# Patient Record
Sex: Female | Born: 1950
Health system: Southern US, Community
[De-identification: ages and names within clinical notes are randomized; demographics above are authoritative.]

## PROBLEM LIST (undated history)

## (undated) DIAGNOSIS — Z972 Presence of dental prosthetic device (complete) (partial): Secondary | ICD-10-CM

## (undated) DIAGNOSIS — K219 Gastro-esophageal reflux disease without esophagitis: Secondary | ICD-10-CM

## (undated) DIAGNOSIS — J449 Chronic obstructive pulmonary disease, unspecified: Secondary | ICD-10-CM

## (undated) DIAGNOSIS — Z973 Presence of spectacles and contact lenses: Secondary | ICD-10-CM

## (undated) DIAGNOSIS — M1990 Unspecified osteoarthritis, unspecified site: Secondary | ICD-10-CM

## (undated) DIAGNOSIS — I6529 Occlusion and stenosis of unspecified carotid artery: Secondary | ICD-10-CM

## (undated) DIAGNOSIS — I509 Heart failure, unspecified: Secondary | ICD-10-CM

## (undated) DIAGNOSIS — I447 Left bundle-branch block, unspecified: Secondary | ICD-10-CM

## (undated) DIAGNOSIS — T7840XA Allergy, unspecified, initial encounter: Secondary | ICD-10-CM

## (undated) DIAGNOSIS — G5600 Carpal tunnel syndrome, unspecified upper limb: Secondary | ICD-10-CM

## (undated) DIAGNOSIS — I420 Dilated cardiomyopathy: Secondary | ICD-10-CM

## (undated) HISTORY — DX: Allergy, unspecified, initial encounter: T78.40XA

## (undated) HISTORY — DX: Gastro-esophageal reflux disease without esophagitis: K21.9

## (undated) HISTORY — PX: CYST EXCISION: SHX5701

## (undated) HISTORY — DX: Presence of spectacles and contact lenses: Z97.3

## (undated) HISTORY — DX: Occlusion and stenosis of unspecified carotid artery: I65.29

## (undated) HISTORY — DX: Presence of dental prosthetic device (complete) (partial): Z97.2

---

## 1972-03-25 HISTORY — PX: ABDOMINAL HYSTERECTOMY: SHX81

## 1998-03-25 HISTORY — PX: HERNIA REPAIR: SHX51

## 1998-03-26 ENCOUNTER — Emergency Department (HOSPITAL_COMMUNITY): Admission: EM | Admit: 1998-03-26 | Discharge: 1998-03-26 | Payer: Self-pay | Admitting: Emergency Medicine

## 1998-03-26 ENCOUNTER — Encounter: Payer: Self-pay | Admitting: Emergency Medicine

## 1999-09-24 ENCOUNTER — Other Ambulatory Visit: Admission: RE | Admit: 1999-09-24 | Discharge: 1999-09-24 | Payer: Self-pay | Admitting: Internal Medicine

## 1999-10-29 ENCOUNTER — Encounter: Payer: Self-pay | Admitting: Internal Medicine

## 1999-10-29 ENCOUNTER — Encounter: Admission: RE | Admit: 1999-10-29 | Discharge: 1999-10-29 | Payer: Self-pay | Admitting: Internal Medicine

## 2000-01-24 ENCOUNTER — Emergency Department (HOSPITAL_COMMUNITY): Admission: EM | Admit: 2000-01-24 | Discharge: 2000-01-24 | Payer: Self-pay | Admitting: Emergency Medicine

## 2000-01-28 ENCOUNTER — Emergency Department (HOSPITAL_COMMUNITY): Admission: EM | Admit: 2000-01-28 | Discharge: 2000-01-29 | Payer: Self-pay

## 2003-03-26 HISTORY — PX: KNEE SURGERY: SHX244

## 2003-04-06 ENCOUNTER — Encounter: Admission: RE | Admit: 2003-04-06 | Discharge: 2003-04-06 | Payer: Self-pay | Admitting: Family Medicine

## 2003-10-10 ENCOUNTER — Ambulatory Visit (HOSPITAL_COMMUNITY): Admission: RE | Admit: 2003-10-10 | Discharge: 2003-10-10 | Payer: Self-pay | Admitting: Family Medicine

## 2004-03-25 HISTORY — PX: COLONOSCOPY: SHX174

## 2004-04-09 ENCOUNTER — Ambulatory Visit (HOSPITAL_COMMUNITY): Admission: RE | Admit: 2004-04-09 | Discharge: 2004-04-09 | Payer: Self-pay | Admitting: Family Medicine

## 2005-11-27 ENCOUNTER — Encounter (HOSPITAL_COMMUNITY): Admission: RE | Admit: 2005-11-27 | Discharge: 2005-12-21 | Payer: Self-pay | Admitting: Orthopedic Surgery

## 2006-03-25 HISTORY — PX: FOOT NEUROMA SURGERY: SHX646

## 2006-10-23 ENCOUNTER — Ambulatory Visit (HOSPITAL_COMMUNITY): Admission: RE | Admit: 2006-10-23 | Discharge: 2006-10-23 | Payer: Self-pay | Admitting: Family Medicine

## 2006-11-05 ENCOUNTER — Ambulatory Visit (HOSPITAL_COMMUNITY): Admission: RE | Admit: 2006-11-05 | Discharge: 2006-11-05 | Payer: Self-pay | Admitting: Family Medicine

## 2008-05-19 ENCOUNTER — Ambulatory Visit (HOSPITAL_COMMUNITY): Admission: RE | Admit: 2008-05-19 | Discharge: 2008-05-19 | Payer: Self-pay | Admitting: Podiatry

## 2008-06-02 ENCOUNTER — Ambulatory Visit (HOSPITAL_COMMUNITY): Admission: RE | Admit: 2008-06-02 | Discharge: 2008-06-02 | Payer: Self-pay | Admitting: Orthopaedic Surgery

## 2010-01-14 ENCOUNTER — Emergency Department (HOSPITAL_COMMUNITY): Admission: EM | Admit: 2010-01-14 | Discharge: 2010-01-14 | Payer: Self-pay | Admitting: Emergency Medicine

## 2010-03-25 HISTORY — PX: KNEE SURGERY: SHX244

## 2010-04-15 ENCOUNTER — Encounter: Payer: Self-pay | Admitting: Urology

## 2010-04-15 ENCOUNTER — Encounter: Payer: Self-pay | Admitting: Internal Medicine

## 2010-08-10 NOTE — Procedures (Signed)
NAMEDOREATHER, HOXWORTH               ACCOUNT NO.:  [id number]   MEDICAL RECORD NO.:  [id number]          PATIENT TYPE:  OUT   LOCATION:  RESP                          FACILITY:  [facility]   PHYSICIAN:  Edward L. Juanetta Gosling, M.D.DATE OF BIRTH:  1950/09/27   DATE OF PROCEDURE:  DATE OF DISCHARGE:  11/05/2006                            PULMONARY FUNCTION TEST   1. Spirometry shows no ventilatory defect, but there is some evidence      of airflow obstruction most marked in the smaller airways.  2. Lung volumes are normal.  3. DLCO is mildly reduced.  4. Arterial blood gases show very slight elevation of pCO2.      Oxygenation is normal.  5. This study is consistent with COPD, probably based on DLCO without      a great deal of lung destruction.      Edward L. Juanetta Gosling, M.D.  Electronically Signed     [facility]/MEDQ  D:  11/06/2006  T:  11/07/2006  Job:  [id number]

## 2010-08-27 ENCOUNTER — Encounter: Payer: Self-pay | Admitting: Medical

## 2010-08-27 ENCOUNTER — Other Ambulatory Visit: Payer: Self-pay | Admitting: Medical

## 2010-08-27 ENCOUNTER — Ambulatory Visit
Admission: RE | Admit: 2010-08-27 | Discharge: 2010-08-27 | Disposition: A | Discharge status: Home / Self Care | Payer: Managed Care, Other (non HMO) | Source: Ambulatory Visit | Attending: Medical | Admitting: Medical

## 2010-08-27 ENCOUNTER — Ambulatory Visit (INDEPENDENT_AMBULATORY_CARE_PROVIDER_SITE_OTHER): Payer: Managed Care, Other (non HMO) | Admitting: Medical

## 2010-08-27 ENCOUNTER — Other Ambulatory Visit (HOSPITAL_COMMUNITY)
Admission: RE | Admit: 2010-08-27 | Discharge: 2010-08-27 | Disposition: A | Discharge status: Home / Self Care | Payer: Managed Care, Other (non HMO) | Source: Ambulatory Visit | Attending: Family Medicine | Admitting: Family Medicine

## 2010-08-27 DIAGNOSIS — Z0000 Encounter for general adult medical examination without abnormal findings: Secondary | ICD-10-CM

## 2010-08-27 DIAGNOSIS — Z1272 Encounter for screening for malignant neoplasm of vagina: Secondary | ICD-10-CM | POA: Insufficient documentation

## 2010-08-27 DIAGNOSIS — D492 Neoplasm of unspecified behavior of bone, soft tissue, and skin: Secondary | ICD-10-CM

## 2010-08-27 DIAGNOSIS — R05 Cough: Secondary | ICD-10-CM

## 2010-08-27 DIAGNOSIS — Z23 Encounter for immunization: Secondary | ICD-10-CM

## 2010-08-27 DIAGNOSIS — F17200 Nicotine dependence, unspecified, uncomplicated: Secondary | ICD-10-CM

## 2010-08-27 DIAGNOSIS — R059 Cough, unspecified: Secondary | ICD-10-CM

## 2010-08-27 DIAGNOSIS — R636 Underweight: Secondary | ICD-10-CM

## 2010-08-27 LAB — COMPREHENSIVE METABOLIC PANEL
AST: 15 U/L (ref 0–37)
Albumin: 4.2 g/dL (ref 3.5–5.2)
Alkaline Phosphatase: 95 U/L (ref 39–117)
BUN: 11 mg/dL (ref 6–23)
Glucose, Bld: 87 mg/dL (ref 70–99)
Potassium: 3.8 mEq/L (ref 3.5–5.3)
Sodium: 141 mEq/L (ref 135–145)
Total Bilirubin: 0.5 mg/dL (ref 0.3–1.2)

## 2010-08-27 LAB — CBC WITH DIFFERENTIAL/PLATELET
Basophils Relative: 0 % (ref 0–1)
Eosinophils Absolute: 0.1 10*3/uL (ref 0.0–0.7)
Eosinophils Relative: 1 % (ref 0–5)
Hemoglobin: 13.7 g/dL (ref 12.0–15.0)
Lymphs Abs: 2 10*3/uL (ref 0.7–4.0)
MCH: 32.5 pg (ref 26.0–34.0)
MCHC: 33.7 g/dL (ref 30.0–36.0)
MCV: 96.4 fL (ref 78.0–100.0)
Monocytes Absolute: 0.9 10*3/uL (ref 0.1–1.0)
Monocytes Relative: 8 % (ref 3–12)
Neutrophils Relative %: 73 % (ref 43–77)
RBC: 4.22 MIL/uL (ref 3.87–5.11)

## 2010-08-27 LAB — POCT URINALYSIS DIPSTICK
Ketones, UA: NEGATIVE
Leukocytes, UA: NEGATIVE
Nitrite, UA: NEGATIVE
Urobilinogen, UA: NEGATIVE
pH, UA: 7

## 2010-08-27 LAB — LIPID PANEL
LDL Cholesterol: 128 mg/dL — ABNORMAL HIGH (ref 0–99)
Total CHOL/HDL Ratio: 3.7 Ratio
VLDL: 13 mg/dL (ref 0–40)

## 2010-08-27 LAB — TSH: TSH: 0.817 u[IU]/mL (ref 0.350–4.500)

## 2010-08-27 NOTE — Patient Instructions (Signed)

## 2010-08-27 NOTE — Progress Notes (Signed)
Subjective:   HPI  Taylor [name] is a 60 y.o. female who presents as a new patient for complete physical. She notes her last physical was 5 years ago, including mammogram and Pap smear. She is due for a repeat colonoscopy at this time. Her last colonoscopy was 6 years ago, and she received a letter from the gastroenterologist to go ahead and schedule repeat now. She notes a few years ago that she had lost a lot of weight, had brought this up to the doctors she was seeing at that time but they dismissed this as benign. She is still concerned about the weight loss, although she has had no additional weight loss. Her weight has been stable for the last few years. She says that she can't seem to gain weight.  She has 2 skin lesions under each breast that she wants removed. They have grown slightly, but are more aggravating than anything. She denies a history of skin cancer. No other changing moles.  She was seen recently through urgent care on at least 2 visits for sinus infection and cough. Despite 2 rounds of antibiotics, cough syrup, and prednisone, she is not completely clear with the cough. She does note a long history of smoking, and at this time enjoys smoking and does not want to quit. She has not had a chest x-ray recently.  No other new complaints. She normally declines flu shot, last tetanus unknown. Otherwise, she has been in decent health, exercises, and has recently been doing a lot of hiking and just [place] for the first time.  Past Medical History  Diagnosis Date  . Allergy     seasonal allergies  . Wears glasses   . GERD (gastroesophageal reflux disease)   . Wears dentures    Past Surgical History  Procedure Date  . Knee surgery   . Abdominal hysterectomy 1974    still has ovaries  . Knee surgery 2005    left knee, cartilage repair  . Knee surgery 2012    left, cartilage repair  . Foot neuroma surgery 2008    right  . Hernia repair 2000    right inguinal  . Colonoscopy  2006    [place], repeat 2012    Review of Systems Constitutional: denies fever, chills, sweats, unexpected weight change, anorexia, fatigue Allergy: negative; denies recent sneezing, itching, congestion Dermatology: denies rash, lumps ENT: Positive mild nasal drainage, no ear pain, sore throat, hoarseness, sinus pain, teeth pain, tinnitus, hearing loss, epistaxis Cardiology: denies chest pain, palpitations, edema, orthopnea, paroxysmal nocturnal dyspnea Respiratory: denies shortness of breath, dyspnea on exertion, wheezing, hemoptysis Gastroenterology: denies abdominal pain, nausea, vomiting, diarrhea, constipation, blood in stool, changes in bowel movement, dysphagia Hematology: denies bleeding or bruising problems Musculoskeletal: denies arthralgias, myalgias, joint swelling, back pain, neck pain, cramping, gait changes Ophthalmology: denies vision changes, eye redness, itching, discharge Urology: denies dysuria, difficulty urinating, hematuria, urinary frequency, urgency, incontinence Neurology: no headache, weakness, tingling, numbness, speech abnormality, memory loss, falls, dizziness Psychology: denies depressed mood, agitation, sleep problems     Objective:   Physical Exam  Filed Vitals:   08/27/10 0907  BP: 145/84  Pulse: 86  Temp: 97.7 F (36.5 C)    General appearance: alert, no distress, WD/WN, white female , looks stated age, a little underweight Skin: 2 small fleshy pedunculated lesions consistent with skin tags underneath both breast, each are about 1 mm diameter 1-2 mm in height, otherwise a few scattered benign appearing macules on chest neck and arms.  Some thickening of the toenails in general. HEENT: normocephalic, conjunctiva/corneas normal, sclerae anicteric, PERRLA, EOMi, nares patent, no discharge or erythema, pharynx normal Oral cavity: MMM, tongue normal, upper dentures, lower edentulous  Neck: supple, no lymphadenopathy, no thyromegaly, no masses, normal  ROM, no bruits, no JVD Chest: non tender, normal shape and expansion Heart: RRR, normal S1, S2, no murmurs Lungs: CTA bilaterally, no wheezes, rhonchi, or rales Abdomen: +bs, soft, non tender, non distended, no masses, no hepatomegaly, no splenomegaly, no bruits Back: non tender, normal ROM, no scoliosis Musculoskeletal: upper extremities non tender, no obvious deformity, normal ROM throughout, lower extremities non tender, no obvious deformity, normal ROM throughout Extremities: no edema, no cyanosis, no clubbing Pulses: 2+ symmetric, upper and lower extremities, normal cap refill Neurological: alert, oriented x 3, CN2-12 intact, strength normal upper extremities and lower extremities, sensation normal throughout, DTRs 2+ throughout, no cerebellar signs, gait normal Breasts: Nontender, no masses, no skin changes, no lymphadenopathy, symmetrical Gyn: Slight atrophy of the external genitalia, slight decreased pigmentation of labia majora, no worrisome lesions, no discharge, no masses, no adnexal tenderness, spatula Pap smear taken, exam chaperoned by nurse Rectal: Anus normal appearing, rectal deferred to gastroenterology for her upcoming colonoscopy Psychiatric: normal affect, behavior normal, pleasant    Assessment :    Encounter Diagnoses  Name Primary?  . General medical examination Yes  . Tobacco use disorder   . Cough   . Underweight   . Neoplasm of skin      Plan:    Preventative care-updated her TD booster today, gave handout on preventative care, labs today, we schedule her for screening mammogram, Pap smear sent status post hysterectomy, and she will call her gastroenterologist to reschedule her colonoscopy. Discussed healthy lifestyle, diet, exercise, vaccination, and preventative care.  Tobacco use disorder-discussed risk of tobacco use, strongly advise she stop smoking. She is not motivated to quit at this time.  Cough-given her tobacco history and recent ongoing  symptoms, we will send her for chest x-ray  Underweight-labs today  Neoplasm skin-at her request and consent, we cleaned and prepped her chest wall in normal sterile fashion,  used 1% lidocaine without epi for local anesthesia, removed 1 skin tag bilaterally  inferior to breast with scissors, no blood loss, cover with bandage, patient tolerated procedure well.  Pre-and postop diagnosis is skin tag.

## 2010-08-28 ENCOUNTER — Telehealth: Payer: Self-pay | Admitting: *Deleted

## 2010-08-28 NOTE — Telephone Encounter (Addendum)
Message copied by Dorthula Perfect on Tue Aug 28, 2010 11:31 AM ------      Message from: Jac Canavan      Created: Tue Aug 28, 2010  9:27 AM       1) liver, kidney, electrolytes such as sodium/potassium, hemoglobin, thyroid all normal.  Cholesterol numbers look ok.        2) her white blood count was up a little, likely due to the recent sinus issues.  Nothing to worry about.      3) her CXR showed no mass, no pneumonia, but does show changes suggestive of COPD.  I again recommend she consider the risks of tobacco and think about stop smoking.      4)   she had microscopic blood in her urine.  Lets have her come in for repeat urinalysis and urine micro, BP and weight check in 2 wk.         Pt informed of lab results.  Will return in 2 weeks on September 10, 2010 at 9:15 am for repeat ua, urine micr, bp and weight check.  CM, LPN

## 2010-09-04 ENCOUNTER — Ambulatory Visit
Admission: RE | Admit: 2010-09-04 | Discharge: 2010-09-04 | Disposition: A | Discharge status: Home / Self Care | Payer: Managed Care, Other (non HMO) | Source: Ambulatory Visit

## 2010-09-04 ENCOUNTER — Ambulatory Visit
Admission: RE | Admit: 2010-09-04 | Discharge: 2010-09-04 | Disposition: A | Discharge status: Home / Self Care | Payer: Managed Care, Other (non HMO) | Source: Ambulatory Visit | Attending: Medical | Admitting: Medical

## 2010-09-04 DIAGNOSIS — Z0000 Encounter for general adult medical examination without abnormal findings: Secondary | ICD-10-CM

## 2010-09-05 ENCOUNTER — Telehealth: Payer: Self-pay | Admitting: *Deleted

## 2010-09-05 NOTE — Telephone Encounter (Addendum)
Message copied by Dorthula Perfect on Wed Sep 05, 2010 11:00 AM ------      Message from: Jac Canavan      Created: Tue Sep 04, 2010  5:06 PM       Pap smear normal, but have her f/u as planned in a week.  FYI for shane - labia sclerosis?  Consider gyn referral   Pt notified of pap smear results.  Has an appointment on 09-10-10 at 8:30am for a follow up.  CM, LPN

## 2010-09-10 ENCOUNTER — Encounter: Payer: Self-pay | Admitting: Medical

## 2010-09-10 ENCOUNTER — Ambulatory Visit (INDEPENDENT_AMBULATORY_CARE_PROVIDER_SITE_OTHER): Payer: Managed Care, Other (non HMO) | Admitting: Medical

## 2010-09-10 VITALS — BP 130/80 | HR 87 | Temp 97.6°F | Ht 64.0 in | Wt 103.0 lb

## 2010-09-10 DIAGNOSIS — R05 Cough: Secondary | ICD-10-CM

## 2010-09-10 DIAGNOSIS — R030 Elevated blood-pressure reading, without diagnosis of hypertension: Secondary | ICD-10-CM

## 2010-09-10 DIAGNOSIS — N390 Urinary tract infection, site not specified: Secondary | ICD-10-CM | POA: Insufficient documentation

## 2010-09-10 DIAGNOSIS — R059 Cough, unspecified: Secondary | ICD-10-CM

## 2010-09-10 DIAGNOSIS — J309 Allergic rhinitis, unspecified: Secondary | ICD-10-CM

## 2010-09-10 DIAGNOSIS — R319 Hematuria, unspecified: Secondary | ICD-10-CM

## 2010-09-10 DIAGNOSIS — N9089 Other specified noninflammatory disorders of vulva and perineum: Secondary | ICD-10-CM

## 2010-09-10 LAB — POCT URINALYSIS DIPSTICK
Bilirubin, UA: NEGATIVE
Blood, UA: POSITIVE
Leukocytes, UA: NEGATIVE
Nitrite, UA: NEGATIVE
Protein, UA: NEGATIVE
pH, UA: 5

## 2010-09-10 MED ORDER — MOMETASONE FUROATE 50 MCG/ACT NA SUSP: 2.0000 | Freq: Every day | NASAL | Status: DC

## 2010-09-10 NOTE — Patient Instructions (Signed)
Recheck with nurse in 56mo.

## 2010-09-10 NOTE — Progress Notes (Signed)
Subjective:   HPI  Taylor [name] is a 60 y.o. female who presents for follow up from her recent complete physical on 08/27/10.    She c/t to report ongoing cough over a month now.  Since last visit no improvement despite OTC Zyrtec and cough medication.  She went for the CXR last visit which did show COPD changes.  She notes no worse or better in regards to cough.  She is having post nasal drip and allergy symptoms though.    She is here for f/u on elevated BP, weight loss, and hematuria.  She notes longstanding hx/o hematuria.   She notes being seen by Urology as a teenager, but no worrisome cause found.  She has not had any Urology f/u since teenage years though.  As she mentioned last visit, at one point she had lost weight, and has had difficulty regaining weight.   She notes being at current weight for a while now.  She is not continuing to lose weight.  She has not called GI yet to reschedule her colonoscopy.    She is also here for f/u vulvar pigmentation changes noted on pelvic exam last visit.  She wasn't aware of any hypopigmentation and hasn't had any pelvic c/o.    The following portions of the patient's history were reviewed and updated as appropriate: allergies, current medications, past family history, past medical history, past social history, past surgical history and problem list.  Past Medical History  Diagnosis Date  . Allergy     seasonal allergies  . Wears glasses   . GERD (gastroesophageal reflux disease)   . Wears dentures    Past Surgical History  Procedure Date  . Knee surgery   . Abdominal hysterectomy 1974    still has ovaries  . Knee surgery 2005    left knee, cartilage repair  . Knee surgery 2012    left, cartilage repair  . Foot neuroma surgery 2008    right  . Hernia repair 2000    right inguinal  . Colonoscopy 2006    [place], repeat 2012    Review of Systems Constitutional: denies fever, chills, sweats, anorexia, fatigue Allergy:  +congestion, sneezing Dermatology: denies rash ENT: +runny nose; no ear pain, sore throat, hoarseness, sinus pain Cardiology: denies chest pain, palpitations, edema Respiratory: +cough; denies shortness of breath, wheezing Gastroenterology: denies abdominal pain, nausea, vomiting, diarrhea, constipation Hematology: denies bleeding or bruising problems Musculoskeletal: denies arthralgias, myalgias, joint swelling, back pain Ophthalmology: denies vision changes Urology: denies dysuria, difficulty urinating, hematuria, urinary frequency, urgency Neurology: no headache, weakness, tingling, numbness      Objective:   Physical Exam  General appearance: alert, no distress, WD/WN, thin white female HEENT: normocephalic, sclerae anicteric, PERRLA, EOMi, nares patent, no discharge, nares with mild erythema, pharynx with mild erythema and post nasal drip Oral cavity: MMM, no lesions Neck: supple, no lymphadenopathy, no thyromegaly, no masses Heart: RRR, normal S1, S2, no murmurs Lungs: CTA bilaterally, no wheezes, rhonchi, or rales Extremities: no edema, no cyanosis, no clubbing Pulses: 2+ symmetric, upper and lower extremities, normal cap refill   Assessment :    Encounter Diagnoses  Name Primary?  . Cough Yes  . Elevated blood pressure reading without diagnosis of hypertension   . Vulvar lesion   . Allergic rhinitis   . Hematuria   . Urinary tract infection, site not specified     Plan:  We reviewed her labs and CXR results from last visit.  Cough - likely  allergen and post nasal drip etiology.  Gave samples of Nasonex, c/t Zyrtec OTC.  She has failed Fluticasone nasal x 46mo.  Discussed recent CXR results which showed COPD changes but no mass or pneumonia.   If not improving in 2 wk, consider Singulair.  Elevated BP - improved today.  Nurse visit BP check on this in 46mo.  Vulvar lesion - likely lichen sclerosis, however, we will refer for further eval.  Allergic rhinitis - same  plan as cough.  Hematuria - longstanding per pt since a teenager with Urology consult as a teenager.  I advised that given the continued hematuria, age, and smoking hx/o, recommended Urology consult.  She declines today.  Thus, recheck 1 more urinalysis in 46mo (nurse visit).  If still hematuria +, refer to Urology.  Return in 46mo nurse visit recheck on weight, BP, and urinalysis.

## 2010-09-25 ENCOUNTER — Other Ambulatory Visit: Payer: Self-pay | Admitting: Medical

## 2010-09-25 DIAGNOSIS — Z0000 Encounter for general adult medical examination without abnormal findings: Secondary | ICD-10-CM

## 2011-01-07 LAB — BLOOD GAS, ARTERIAL
Acid-Base Excess: 1.4
Bicarbonate: 26.2 — ABNORMAL HIGH
Patient temperature: 37
TCO2: 23.6
pH, Arterial: 7.371

## 2011-01-24 ENCOUNTER — Telehealth: Payer: Self-pay | Admitting: Medical

## 2011-01-24 NOTE — Telephone Encounter (Signed)
PT WAS CALLED AND INFORMED THAT SHE NEEDED AN APPOINTMENT PER SHANE. PT TO CALL BACK.

## 2011-02-08 ENCOUNTER — Ambulatory Visit (HOSPITAL_COMMUNITY)
Admission: RE | Admit: 2011-02-08 | Discharge: 2011-02-08 | Disposition: A | Discharge status: Home / Self Care | Payer: Managed Care, Other (non HMO) | Source: Ambulatory Visit | Attending: Orthopedic Surgery | Admitting: Orthopedic Surgery

## 2011-02-08 DIAGNOSIS — M6281 Muscle weakness (generalized): Secondary | ICD-10-CM | POA: Insufficient documentation

## 2011-02-08 DIAGNOSIS — R262 Difficulty in walking, not elsewhere classified: Secondary | ICD-10-CM | POA: Insufficient documentation

## 2011-02-08 DIAGNOSIS — IMO0001 Reserved for inherently not codable concepts without codable children: Secondary | ICD-10-CM | POA: Insufficient documentation

## 2011-02-08 DIAGNOSIS — M25673 Stiffness of unspecified ankle, not elsewhere classified: Secondary | ICD-10-CM | POA: Insufficient documentation

## 2011-02-08 DIAGNOSIS — M25579 Pain in unspecified ankle and joints of unspecified foot: Secondary | ICD-10-CM | POA: Insufficient documentation

## 2011-02-08 DIAGNOSIS — S82899A Other fracture of unspecified lower leg, initial encounter for closed fracture: Secondary | ICD-10-CM | POA: Insufficient documentation

## 2011-02-08 DIAGNOSIS — M25676 Stiffness of unspecified foot, not elsewhere classified: Secondary | ICD-10-CM | POA: Insufficient documentation
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
## 2011-02-19 ENCOUNTER — Ambulatory Visit (HOSPITAL_COMMUNITY)
Admission: RE | Admit: 2011-02-19 | Discharge: 2011-02-19 | Disposition: A | Discharge status: Home / Self Care | Payer: Managed Care, Other (non HMO) | Source: Ambulatory Visit | Attending: Physical Therapy | Admitting: Physical Therapy

## 2011-02-19 NOTE — Progress Notes (Signed)
Physical Therapy Treatment Patient Details  Name: Taylor [name] MRN: [id number] Date of Birth: May 29, 1950  Today's Date: 02/19/2011 Time: 8469-6295 Time Calculation (min): 44 min Visit#: 2  of 6   Re-eval: 03/01/11 Charges:  therex 40'   Subjective: Symptoms/Limitations Symptoms: Pt. reports she had severe pain and swelling into her Right knee after last visit.  Pt. went to MD and he put her knee into hinge brace and has ordered an MRI (having This Friday  02/22/11 ).  Pt. is now wearing ALSO    Pain Assessment Currently in Pain?: No/denies  Exercise/Treatments Ankle Exercises Ankle Dorsiflexion: 10 reps;Theraband Theraband Level (Ankle Dorsiflexion): Level 4 (Blue) Ankle Plantar Flexion: 10 reps;Theraband Theraband Level (Ankle Plantar Flexion): Level 4 (Blue) Ankle Eversion: 10 reps;Theraband Theraband Level (Ankle Eversion): Level 4 (Blue) Ankle Inversion: 10 reps;Theraband Theraband Level (Ankle Inversion): Level 4 (Blue) Heel Raises: 10 reps;Limitations Heel Raises Limitations: Seated Toe Raise: 10 reps;Limitations Toe Raise Limitations: Seated  Towel Crunch: Limitations Towel Crunch Limitations: 2 minutes Towel Inversion/Eversion: 5 reps   Physical Therapy Assessment and Plan PT Assessment and Plan Clinical Impression Statement: Held off adding standing therex secondary to unknown cause of R knee pain/getting MRI on Friday.  Progressed to blue tband without increased pain.   PT Plan: Progress per POC, progress to standing if MRI of knee negative.  await results.     Problem List Patient Active Problem List  Diagnoses  . Urinary tract infection, site not specified  . Pain in joint, ankle and foot  . Difficulty in walking  . Avulsion fracture of ankle  . Stiffness of joint, not elsewhere classified, ankle and foot    PT - End of Session Activity Tolerance: Patient tolerated treatment well General Behavior During Session: [facility] for tasks  performed Cognition: [facility] for tasks performed  Emeline Gins B 02/19/2011, 4:10 PM

## 2011-02-21 ENCOUNTER — Ambulatory Visit (HOSPITAL_COMMUNITY)
Admission: RE | Admit: 2011-02-21 | Discharge: 2011-02-21 | Disposition: A | Discharge status: Home / Self Care | Payer: Managed Care, Other (non HMO) | Source: Ambulatory Visit | Attending: Orthopedic Surgery | Admitting: Orthopedic Surgery

## 2011-02-21 NOTE — Progress Notes (Signed)
Physical Therapy Treatment Patient Details  Name: Taylor [name] MRN: [id number] Date of Birth: 1950/08/31  Today's Date: 02/21/2011 Time: 9528-4132 Time Calculation (min): 32 min Visit#: 3  of 6   Re-eval: 03/01/11  Charge: therex 32 min  Subjective: Symptoms/Limitations Symptoms: Pt reports intermittent sharp pain R knee and ankle, 4/10 pain scale. Pain Assessment Currently in Pain?: Yes Pain Score:   4 Pain Location: Ankle Pain Orientation: Right  Objective:   Exercise/Treatments Ankle Exercises Ankle Circles/Pumps Limitations: Gastroc Stretch Long Sitting 3x 30 sec with towel  Ankle Dorsiflexion: 15 reps;Theraband Theraband Level (Ankle Dorsiflexion): Level 4 (Blue) Ankle Plantar Flexion: 15 reps;Theraband Theraband Level (Ankle Plantar Flexion): Level 4 (Blue) Ankle Eversion: 15 reps;Theraband Theraband Level (Ankle Eversion): Level 4 (Blue) Ankle Inversion: 15 reps;Theraband Theraband Level (Ankle Inversion): Level 4 (Blue) Heel Raises: 15 reps;Limitations Heel Raises Limitations: Seated Toe Raise: 15 reps;Limitations Toe Raise Limitations: Seated  Towel Crunch: Limitations Towel Crunch Limitations: 2 minutes Towel Inversion/Eversion: 5 reps BAPS: Level 2;Sitting;15 reps;Limitations BAPS Limitations: PF <-> DF; INV <-> EV; CW <-> CCW  Plyometrics       Physical Therapy Assessment and Plan PT Assessment and Plan Clinical Impression Statement: Held off standing therex secondy to unknown cause of R knee pain/ waiting for MRI to be complete on Friday.  Added BAPS board for ankle ROM/stability, pt able to complete without difficutly with knee stabilized by therapist. PT Plan: Progress per POC, progress to standing if MRI of knee negative, await results.  Increase level on BAPS to L3 next session.    Goals    Problem List Patient Active Problem List  Diagnoses  . Urinary tract infection, site not specified  . Pain in joint, ankle and foot  . Difficulty  in walking  . Avulsion fracture of ankle  . Stiffness of joint, not elsewhere classified, ankle and foot    PT - End of Session Activity Tolerance: Patient tolerated treatment well General Behavior During Session: [facility] for tasks performed Cognition: [facility] for tasks performed  Juel Burrow 02/21/2011, 3:54 PM

## 2011-02-22 ENCOUNTER — Ambulatory Visit (HOSPITAL_COMMUNITY): Payer: Managed Care, Other (non HMO)

## 2011-02-25 ENCOUNTER — Ambulatory Visit (HOSPITAL_COMMUNITY)
Admission: RE | Admit: 2011-02-25 | Discharge: 2011-02-25 | Disposition: A | Discharge status: Home / Self Care | Payer: Managed Care, Other (non HMO) | Source: Ambulatory Visit | Attending: Family Medicine | Admitting: Family Medicine

## 2011-02-25 DIAGNOSIS — M6281 Muscle weakness (generalized): Secondary | ICD-10-CM | POA: Insufficient documentation

## 2011-02-25 DIAGNOSIS — M25673 Stiffness of unspecified ankle, not elsewhere classified: Secondary | ICD-10-CM | POA: Insufficient documentation

## 2011-02-25 DIAGNOSIS — M25579 Pain in unspecified ankle and joints of unspecified foot: Secondary | ICD-10-CM | POA: Insufficient documentation

## 2011-02-25 DIAGNOSIS — M25676 Stiffness of unspecified foot, not elsewhere classified: Secondary | ICD-10-CM | POA: Insufficient documentation

## 2011-02-25 DIAGNOSIS — IMO0001 Reserved for inherently not codable concepts without codable children: Secondary | ICD-10-CM | POA: Insufficient documentation

## 2011-02-25 NOTE — Progress Notes (Signed)
Physical Therapy Treatment Patient Details  Name: Taylor [name] MRN: [id number] Date of Birth: [birth year]  Today's Date: 02/25/2011 Time: 1478-2956 Time Calculation (min): 53 min Visit#: 4  of 6   Re-eval: 03/01/11  Charge: therex: 46 min  Subjective: Symptoms/Limitations Symptoms: Pt reports no R ankle pain, knee bothering her today 5/10 pain scale.  Pt does not have results from MRI, held standing exercises, pt stated should receive results by Wednesday. Pain Assessment Currently in Pain?: Yes Pain Score:   5 Pain Location: Knee Pain Orientation: Right  Objective:   Exercise/Treatments Aerobic Stationary Bike: 7' @ 2.0 Ankle Exercises Ankle Dorsiflexion: 15 reps;Theraband Theraband Level (Ankle Dorsiflexion): Level 4 (Blue) Ankle Plantar Flexion: 15 reps;Theraband Theraband Level (Ankle Plantar Flexion): Level 4 (Blue) Ankle Eversion: Sidelying;Limitations;Seated;Theraband Theraband Level (Ankle Eversion): Level 4 (Blue) Ankle Eversion Limitations: L sidelying ankle eversion 15x 3# Ankle Inversion: 15 reps;Limitations;Seated;Theraband Theraband Level (Ankle Inversion): Level 4 (Blue) Ankle Inversion Limitations: seated with knee crossed 15 reps 3# Heel Raises: 15 reps;3 seconds;Limitations Heel Raises Limitations: Seated with 3# on knee Toe Raise: 15 reps;3 seconds;Limitations Toe Raise Limitations: Seated with 3# on knee  Towel Crunch: Limitations Towel Crunch Limitations: 3 minuted Towel Inversion/Eversion: Limitations Towel Inversion/Eversion Limitations: 3# BAPS: Sitting;Level 3;15 reps;Limitations BAPS Limitations: PF <-> DF; INV <-> EV; CW <-> CCW   Physical Therapy Assessment and Plan PT Assessment and Plan Clinical Impression Statement: Pt tolerated well towards total therex with no c/o of increased pain in ankle.  Held standing therex awaiting MRI results.  Pt able to demonstrate all therex activities correctly without difficulty.  Able to increase to L3  with BAPS, therapist stabilized knee for increased ankle ROM.  Able to add 3# with eversion with towel PT Plan: Progress per PT POC, begin standing therex if MRI negative.    Goals Home Exercise Program Pt will Perform Home Exercise Program: Independently PT Short Term Goals Time to Complete Short Term Goals: 3 weeks PT Short Term Goal 1: Pt will report pain less than or 2/10 for 50% of her day while in the LASO brace.  PT Short Term Goal 2: Pt will improve R ankle strength to WNL to order to tolerate standing and walking activities needed for work. PT Short Term Goal 3: Pt will improve R ankle ROM to [facility] in order to ambulate with appropriate gait pattern for 60 minutes to return to exercise activities.  PT Short Term Goal 4: Pt will demonstrate R SLS x30 sec on static surface with LASO on for improved balance with ambulation.   Problem List Patient Active Problem List  Diagnoses  . Urinary tract infection, site not specified  . Pain in joint, ankle and foot  . Difficulty in walking  . Avulsion fracture of ankle  . Stiffness of joint, not elsewhere classified, ankle and foot    PT - End of Session Activity Tolerance: Patient tolerated treatment well General Behavior During Session: [facility] for tasks performed Cognition: [facility] for tasks performed  Juel Burrow 02/25/2011, 11:22 AM

## 2011-02-26 ENCOUNTER — Telehealth: Payer: Self-pay | Admitting: Family Medicine

## 2011-02-26 NOTE — Telephone Encounter (Signed)
DONE

## 2011-02-27 ENCOUNTER — Ambulatory Visit (HOSPITAL_COMMUNITY): Payer: Managed Care, Other (non HMO) | Admitting: Physical Therapy

## 2011-03-01 ENCOUNTER — Ambulatory Visit (HOSPITAL_COMMUNITY)
Admission: RE | Admit: 2011-03-01 | Discharge: 2011-03-01 | Disposition: A | Discharge status: Home / Self Care | Payer: Managed Care, Other (non HMO) | Source: Ambulatory Visit | Attending: Family Medicine | Admitting: Family Medicine

## 2011-03-01 NOTE — Progress Notes (Signed)
Physical Therapy Treatment Patient Details  Name: Taylor [name] MRN: [id number] Date of Birth: 14-May-1950  Today's Date: 03/01/2011 Time: 7829-5621 Time Calculation (min): 38 min Visit#: 5  of 6   Re-eval: 03/01/11 Charges: Therex x 33' Self care x 5'  Subjective: Symptoms/Limitations Symptoms: Pt reprots that she is having some pain that she attributes to beingon her feet all day.  Pain Assessment Currently in Pain?: Yes Pain Score:   2 Pain Location: Ankle Pain Orientation: Left   Exercise/Treatments Stretches Gastroc Stretch: 3 reps;30 seconds;Limitations Gastroc Stretch Limitations: w/slant board Soleus Stretch: 3 reps;30 seconds Standing SLS: 1'; SLS with vectors 3x10" Other Standing Knee Exercises: tandem gt on foam 2 RT Balance Exercises Heel Raises: 10 reps Heel Raises Limitations: standing Toe Raise: 10 reps Toe Raise Limitations: standing  Physical Therapy Assessment and Plan PT Assessment and Plan Clinical Impression Statement: Began standing exercises secondary to MRI results negative. Pt completes all new exercises without difficulty. HEP given for new exercises. PT Plan: Reassess next session.     Problem List Patient Active Problem List  Diagnoses  . Urinary tract infection, site not specified  . Pain in joint, ankle and foot  . Difficulty in walking  . Avulsion fracture of ankle  . Stiffness of joint, not elsewhere classified, ankle and foot    PT - End of Session Activity Tolerance: Patient tolerated treatment well General Behavior During Session: [facility] for tasks performed Cognition: [facility] for tasks performed  Antonieta Iba 03/01/2011, 4:01 PM

## 2011-03-04 ENCOUNTER — Ambulatory Visit (HOSPITAL_COMMUNITY)
Admission: RE | Admit: 2011-03-04 | Discharge: 2011-03-04 | Disposition: A | Discharge status: Home / Self Care | Payer: Managed Care, Other (non HMO) | Source: Ambulatory Visit | Attending: Family Medicine | Admitting: Family Medicine

## 2011-03-04 NOTE — Progress Notes (Signed)
Physical Therapy D/C Note  Patient Details  Name: Taylor [name] MRN: [id number] Date of Birth: 02-Jun-1950  Today's Date: 03/04/2011 Time: 4098-1191 Time Calculation (min): 35 min Charges: 1 MMT, 1 ROM, 15' TE, 12' Self Care Visit#: 6  of 6   Re-eval:      Past Medical History:  Past Medical History  Diagnosis Date  . Allergy     seasonal allergies  . Wears glasses   . GERD (gastroesophageal reflux disease)   . Wears dentures    Past Surgical History:  Past Surgical History  Procedure Date  . Knee surgery   . Abdominal hysterectomy 1974    still has ovaries  . Knee surgery 2005    left knee, cartilage repair  . Knee surgery 2012    left, cartilage repair  . Foot neuroma surgery 2008    right  . Hernia repair 2000    right inguinal  . Colonoscopy 2006    [place], repeat 2012    Subjective Symptoms/Limitations Symptoms: Pt reports that her average pain is about a 2/10 overall.  Yesterday she had some increased pain yesterday.  Overall she reports she is doing much better.  She has walked in her house without the ALSO, but continues to wear it during work.  Pain Assessment Currently in Pain?: Yes Pain Score:   2  Precaution Comments ALSO Functional Tests Lower Extremity functional Scale: 74/80 Functional Tests PALPATION: Pain and tenderness over the distal ATFL  Functional Tests OBSERVATION: Tight gastroc and soleus, that has improved moderatly with stretching activities.  No swelling RLE AROM (degrees) Right Ankle Dorsiflexion 0-20 5  Right Ankle Plantar Flexion 0-45 40  Right Ankle Inversion 38  Right Ankle Eversion 15  RLE Strength Right Ankle Dorsiflexion 5/5 Right Ankle Plantar Flexion 4/5 Right Ankle Inversion 5/5 Right Ankle Eversion 5/5 Static Standing Balance Static Standing - Comment/# of Minutes On static surface Single Leg Stance - Right Leg 30     Exercise/Treatments Stretches Gastroc Stretch: 4 reps;30 seconds Gastroc Stretch  Limitations: w/slant board Soleus Stretch: 4 reps;30 seconds Towel Crunch: Limitations Towel Crunch Limitations: 2 min Towel Inversion/Eversion: 5 reps Towel Inversion/Eversion Limitations: 3# R SLS: 30 sec on static surface R Heel Raises x10 Educated pt on proper weaning of her brace at home and work.  Educated to continue to advance HEP as tolerated by increasing weight, reps and difficulty with balance surfaces.  Physical Therapy Assessment and Plan PT Assessment and Plan Clinical Impression Statement: Ms. Taylor [name] has attended 6 of 6 OPPT visits and has met 4/4 goals.  She has improved ankle ROM, strength, balance and percieved functional ability.  She continues to have her greatest limitation with mild gait abnormality with the ALSO and some intermittent pain to her ATFL insertion and to her R knee.  She has been educated to wean out of her brace at home and eventually wean out at work as she is able to tolerate greater amount of activity.  She will continue to benefit from a HEP. PT Plan: D/C w/advanced HEP    Goals Home Exercise Program Pt will Perform Home Exercise Program: Independently PT Short Term Goals Time to Complete Short Term Goals: 3 weeks PT Short Term Goal 1: Pt will report pain less than or 2/10 for 50% of her day while in the LASO brace.  PT Short Term Goal 1 - Progress: Met PT Short Term Goal 2: Pt will improve R ankle strength to WNL to order to tolerate standing  and walking activities needed for work. PT Short Term Goal 2 - Progress: Met PT Short Term Goal 3: Pt will improve R ankle ROM to [facility] in order to ambulate with appropriate gait pattern for 60 minutes to return to exercise activities.  PT Short Term Goal 3 - Progress: Met PT Short Term Goal 4: Pt will demonstrate R SLS x30 sec on static surface with LASO on for improved balance with ambulation.  PT Short Term Goal 4 - Progress: Met  Problem List Patient Active Problem List  Diagnoses  . Urinary tract  infection, site not specified  . Pain in joint, ankle and foot  . Difficulty in walking  . Avulsion fracture of ankle  . Stiffness of joint, not elsewhere classified, ankle and foot     Taylor [name] 03/04/2011, 10:55 AM  Physician Documentation Your signature is required to indicate approval of the treatment plan as stated above.  Please sign and either send electronically or make a copy of this report for your files and return this physician signed original.   Please mark one 1.__approve of plan  2. ___approve of plan with the following conditions.   ______________________________                                                          _____________________ Physician Signature                                                                                                             Date

## 2011-03-06 ENCOUNTER — Ambulatory Visit (HOSPITAL_COMMUNITY): Payer: Managed Care, Other (non HMO) | Admitting: Physical Therapy

## 2011-03-08 ENCOUNTER — Inpatient Hospital Stay (HOSPITAL_COMMUNITY): Admission: RE | Admit: 2011-03-08 | Payer: Managed Care, Other (non HMO) | Source: Ambulatory Visit

## 2011-08-12 ENCOUNTER — Other Ambulatory Visit: Payer: Self-pay | Admitting: Orthopedic Surgery

## 2011-08-13 ENCOUNTER — Encounter (HOSPITAL_BASED_OUTPATIENT_CLINIC_OR_DEPARTMENT_OTHER): Payer: Self-pay | Admitting: *Deleted

## 2011-08-13 NOTE — Progress Notes (Signed)
No labs needed

## 2011-08-14 NOTE — H&P (Signed)
Taylor [name] is an 61 y.o. female.   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  She does not knead dough, but does place prepared dough and bakes and presents the baked goods.  She has had a one year history of right hand numbness and tingling in the median innervated fingers.  Her symptoms are intermittent.  She has difficulty sleeping every night.  She has history of discomfort and blanching of her left index finger distal phalangeal segment.  This is brought on by exposure to cold in the refrigerator at work.      Past Medical History  Diagnosis Date  . Allergy     seasonal allergies  . Wears glasses   . GERD (gastroesophageal reflux disease)   . Wears dentures   . COPD (chronic obstructive pulmonary disease)   . Arthritis   . Carpal tunnel syndrome     Past Surgical History  Procedure Date  . Knee surgery 2012    left, cartilage repair  . Abdominal hysterectomy 1974    still has ovaries  . Knee surgery 2005    left knee, cartilage repair  . Knee surgery 2012    left, cartilage repair  . Foot neuroma surgery 2008    right  . Colonoscopy 2006    [place], repeat 2012  . Hernia repair 2000    right inguinal    Family History  Problem Relation Age of Onset  . Aneurysm Mother   . Heart disease Father 26    died of MI  . Cancer Brother     unknown  . Liver disease Brother     1 brother died of cirrhosis   Social History:  reports that she has been smoking Cigarettes.  She has a 35 pack-year smoking history. She has never used smokeless tobacco. She reports that she does not drink alcohol or use illicit drugs.  Allergies:  Allergies  Allergen Reactions  . Contrast Media (Iodinated Diagnostic Agents)     Mental fog, confusion    No prescriptions prior to admission    No results found for this or any previous visit (from the past 48 hour(s)).  No  results found.   Pertinent items are noted in HPI.  Height 5' 3.5" (1.613 m), weight 49.896 kg (110 lb).  General appearance: alert Head: Normocephalic, without obvious abnormality Neck: supple, symmetrical, trachea midline Resp: clear to auscultation bilaterally Cardio: regular rate and rhythm GI: normal findings: bowel sounds normal Extremities:.  Inspection of her hands reveals quite a bit of callus formation evidencing hard work. She has no splinter hemorrhages, no signs of pulp infarctions.  Her radial and ulnar pulses are palpable. Her Allen's test reveals normal flow.  She has positive wrist flexion test on the right and positive Tinel's sign on the right. She has no sign of stenosing tenosynovitis of first dorsal compartments, fingers or thumbs.    ELECTRODIAGNOSTIC STUDIES:   Dr. Johna Roles completed detailed electrodiagnostic studies. These reveal moderately severe right carpal tunnel syndrome.  Pulses: 2+ and symmetric Skin: normal Neurologic: Grossly normal    Assessment/Plan Impression: Right CTS  Plan: To the OR for right CTR. The procedure, risks and post-op course were discussed with the patient at length and she was in agreement with the plan.  DASNOIT,Conor Filsaime J 08/14/2011, 4:53 PM     H&P documentation: 08/15/2011  -History and Physical Reviewed  -  Patient has been re-examined  -No change in the plan of care  Cammie Sickle, MD

## 2011-08-15 ENCOUNTER — Encounter (HOSPITAL_BASED_OUTPATIENT_CLINIC_OR_DEPARTMENT_OTHER)
Admission: RE | Disposition: A | Discharge status: Home / Self Care | Payer: Self-pay | Source: Ambulatory Visit | Attending: Orthopedic Surgery

## 2011-08-15 ENCOUNTER — Encounter (HOSPITAL_BASED_OUTPATIENT_CLINIC_OR_DEPARTMENT_OTHER): Payer: Self-pay | Admitting: Anesthesiology

## 2011-08-15 ENCOUNTER — Encounter (HOSPITAL_BASED_OUTPATIENT_CLINIC_OR_DEPARTMENT_OTHER): Payer: Self-pay | Admitting: Certified Registered"

## 2011-08-15 ENCOUNTER — Other Ambulatory Visit: Payer: Self-pay

## 2011-08-15 ENCOUNTER — Ambulatory Visit (HOSPITAL_BASED_OUTPATIENT_CLINIC_OR_DEPARTMENT_OTHER): Payer: Managed Care, Other (non HMO) | Admitting: Anesthesiology

## 2011-08-15 ENCOUNTER — Encounter (HOSPITAL_BASED_OUTPATIENT_CLINIC_OR_DEPARTMENT_OTHER): Payer: Self-pay | Admitting: *Deleted

## 2011-08-15 ENCOUNTER — Ambulatory Visit (HOSPITAL_BASED_OUTPATIENT_CLINIC_OR_DEPARTMENT_OTHER)
Admission: RE | Admit: 2011-08-15 | Discharge: 2011-08-15 | Disposition: A | Discharge status: Home / Self Care | Payer: Managed Care, Other (non HMO) | Source: Ambulatory Visit | Attending: Orthopedic Surgery | Admitting: Orthopedic Surgery

## 2011-08-15 DIAGNOSIS — J449 Chronic obstructive pulmonary disease, unspecified: Secondary | ICD-10-CM | POA: Insufficient documentation

## 2011-08-15 DIAGNOSIS — F17200 Nicotine dependence, unspecified, uncomplicated: Secondary | ICD-10-CM | POA: Insufficient documentation

## 2011-08-15 DIAGNOSIS — G5600 Carpal tunnel syndrome, unspecified upper limb: Secondary | ICD-10-CM | POA: Insufficient documentation

## 2011-08-15 DIAGNOSIS — K219 Gastro-esophageal reflux disease without esophagitis: Secondary | ICD-10-CM | POA: Insufficient documentation

## 2011-08-15 DIAGNOSIS — J4489 Other specified chronic obstructive pulmonary disease: Secondary | ICD-10-CM | POA: Insufficient documentation

## 2011-08-15 HISTORY — DX: Chronic obstructive pulmonary disease, unspecified: J44.9

## 2011-08-15 HISTORY — DX: Carpal tunnel syndrome, unspecified upper limb: G56.00

## 2011-08-15 HISTORY — PX: CARPAL TUNNEL RELEASE: SHX101

## 2011-08-15 HISTORY — DX: Unspecified osteoarthritis, unspecified site: M19.90

## 2011-08-15 LAB — POCT HEMOGLOBIN-HEMACUE: Hemoglobin: 12.5 g/dL (ref 12.0–15.0)

## 2011-08-15 SURGERY — CARPAL TUNNEL RELEASE
Anesthesia: General | Site: Hand | Laterality: Right | Wound class: Clean

## 2011-08-15 MED ORDER — CHLORHEXIDINE GLUCONATE 4 % EX LIQD: 60.0000 mL | Freq: Once | CUTANEOUS | Status: DC

## 2011-08-15 MED ORDER — PROPOFOL 10 MG/ML IV EMUL
INTRAVENOUS | Status: DC | PRN
  Administered 2011-08-15: 165 mg via INTRAVENOUS

## 2011-08-15 MED ORDER — LIDOCAINE HCL (CARDIAC) 20 MG/ML IV SOLN
INTRAVENOUS | Status: DC | PRN
  Administered 2011-08-15: 40 mg via INTRAVENOUS

## 2011-08-15 MED ORDER — ONDANSETRON HCL 4 MG/2ML IJ SOLN
INTRAMUSCULAR | Status: DC | PRN
  Administered 2011-08-15: 4 mg via INTRAVENOUS

## 2011-08-15 MED ORDER — HYDROCODONE-ACETAMINOPHEN 5-325 MG PO TABS: ORAL_TABLET | ORAL | Status: AC

## 2011-08-15 MED ORDER — METOCLOPRAMIDE HCL 5 MG/ML IJ SOLN
INTRAMUSCULAR | Status: DC | PRN
  Administered 2011-08-15: 10 mg via INTRAVENOUS

## 2011-08-15 MED ORDER — LACTATED RINGERS IV SOLN
INTRAVENOUS | Status: DC
  Administered 2011-08-15: 07:00:00 via INTRAVENOUS

## 2011-08-15 MED ORDER — METOCLOPRAMIDE HCL 5 MG/ML IJ SOLN: 10.0000 mg | Freq: Once | INTRAMUSCULAR | Status: DC | PRN

## 2011-08-15 MED ORDER — LIDOCAINE HCL 2 % IJ SOLN
INTRAMUSCULAR | Status: DC | PRN
  Administered 2011-08-15: 3 mL

## 2011-08-15 MED ORDER — FENTANYL CITRATE 0.05 MG/ML IJ SOLN
INTRAMUSCULAR | Status: DC | PRN
  Administered 2011-08-15: 50 ug via INTRAVENOUS

## 2011-08-15 MED ORDER — FENTANYL CITRATE 0.05 MG/ML IJ SOLN: 25.0000 ug | INTRAMUSCULAR | Status: DC | PRN

## 2011-08-15 MED ORDER — MIDAZOLAM HCL 5 MG/5ML IJ SOLN
INTRAMUSCULAR | Status: DC | PRN
  Administered 2011-08-15: 1 mg via INTRAVENOUS

## 2011-08-15 MED ORDER — DEXAMETHASONE SODIUM PHOSPHATE 4 MG/ML IJ SOLN
INTRAMUSCULAR | Status: DC | PRN
  Administered 2011-08-15: 4 mg via INTRAVENOUS

## 2011-08-15 SURGICAL SUPPLY — 41 items
BANDAGE ADHESIVE 1X3 (GAUZE/BANDAGES/DRESSINGS) IMPLANT
BANDAGE ELASTIC 3 VELCRO ST LF (GAUZE/BANDAGES/DRESSINGS) ×2 IMPLANT
BLADE SURG 15 STRL LF DISP TIS (BLADE) ×1 IMPLANT
BLADE SURG 15 STRL SS (BLADE) ×2
BNDG CMPR 9X4 STRL LF SNTH (GAUZE/BANDAGES/DRESSINGS) ×1
BNDG ESMARK 4X9 LF (GAUZE/BANDAGES/DRESSINGS) ×1 IMPLANT
BRUSH SCRUB EZ PLAIN DRY (MISCELLANEOUS) ×2 IMPLANT
CLOTH BEACON ORANGE TIMEOUT ST (SAFETY) ×2 IMPLANT
CORDS BIPOLAR (ELECTRODE) ×1 IMPLANT
COVER MAYO STAND STRL (DRAPES) ×2 IMPLANT
COVER TABLE BACK 60X90 (DRAPES) ×2 IMPLANT
CUFF TOURNIQUET SINGLE 18IN (TOURNIQUET CUFF) ×1 IMPLANT
DECANTER SPIKE VIAL GLASS SM (MISCELLANEOUS) IMPLANT
DRAPE EXTREMITY T 121X128X90 (DRAPE) ×2 IMPLANT
DRAPE SURG 17X23 STRL (DRAPES) ×2 IMPLANT
GLOVE BIO SURGEON STRL SZ7 (GLOVE) ×1 IMPLANT
GLOVE BIO SURGEON STRL SZ7.5 (GLOVE) ×2 IMPLANT
GLOVE BIOGEL M STRL SZ7.5 (GLOVE) ×2 IMPLANT
GLOVE BIOGEL PI IND STRL 7.5 (GLOVE) IMPLANT
GLOVE BIOGEL PI INDICATOR 7.5 (GLOVE) ×1
GLOVE INDICATOR 7.0 STRL GRN (GLOVE) ×2 IMPLANT
GLOVE ORTHO TXT STRL SZ7.5 (GLOVE) ×3 IMPLANT
GOWN PREVENTION PLUS XLARGE (GOWN DISPOSABLE) ×1 IMPLANT
GOWN PREVENTION PLUS XXLARGE (GOWN DISPOSABLE) ×4 IMPLANT
NEEDLE 27GAX1X1/2 (NEEDLE) IMPLANT
PACK BASIN DAY SURGERY FS (CUSTOM PROCEDURE TRAY) ×2 IMPLANT
PAD CAST 3X4 CTTN HI CHSV (CAST SUPPLIES) ×1 IMPLANT
PADDING CAST ABS 4INX4YD NS (CAST SUPPLIES) ×1
PADDING CAST ABS COTTON 4X4 ST (CAST SUPPLIES) ×1 IMPLANT
PADDING CAST COTTON 3X4 STRL (CAST SUPPLIES) ×2
SPLINT PLASTER CAST XFAST 3X15 (CAST SUPPLIES) ×5 IMPLANT
SPLINT PLASTER XTRA FASTSET 3X (CAST SUPPLIES) ×5
SPONGE GAUZE 4X4 12PLY (GAUZE/BANDAGES/DRESSINGS) ×2 IMPLANT
STOCKINETTE 4X48 STRL (DRAPES) ×2 IMPLANT
STRIP CLOSURE SKIN 1/2X4 (GAUZE/BANDAGES/DRESSINGS) ×2 IMPLANT
SUT PROLENE 3 0 PS 2 (SUTURE) ×2 IMPLANT
SYR 3ML 23GX1 SAFETY (SYRINGE) IMPLANT
SYR CONTROL 10ML LL (SYRINGE) IMPLANT
TRAY DSU PREP LF (CUSTOM PROCEDURE TRAY) ×2 IMPLANT
UNDERPAD 30X30 INCONTINENT (UNDERPADS AND DIAPERS) ×2 IMPLANT
WATER STERILE IRR 1000ML POUR (IV SOLUTION) ×2 IMPLANT

## 2011-08-15 NOTE — Op Note (Signed)
[id number] 

## 2011-08-15 NOTE — Transfer of Care (Signed)
Immediate Anesthesia Transfer of Care Note  Patient: Taylor [name]  Procedure(s) Performed: Procedure(s) (LRB): CARPAL TUNNEL RELEASE (Right)  Patient Location: PACU  Anesthesia Type: General  Level of Consciousness: awake, alert , oriented and patient cooperative  Airway & Oxygen Therapy: Patient Spontanous Breathing and Patient connected to face mask oxygen  Post-op Assessment: Report given to PACU RN and Post -op Vital signs reviewed and stable  Post vital signs: Reviewed and stable  Complications: No apparent anesthesia complications

## 2011-08-15 NOTE — Op Note (Signed)
[name], [name]               ACCOUNT NO.:  [id number]  MEDICAL RECORD NO.:  [id number]  LOCATION:                                 FACILITY:  PHYSICIAN:  Katy Fitch. Janautica Netzley, M.D. DATE OF BIRTH:  1951-02-05  DATE OF PROCEDURE:  08/15/2011 DATE OF DISCHARGE:                              OPERATIVE REPORT   PREOPERATIVE DIAGNOSIS:  Entrapment neuropathy median nerve, right carpal tunnel.  POSTOPERATIVE DIAGNOSIS:  Entrapment neuropathy median nerve, right carpal tunnel.  OPERATION:  Release of right transverse carpal ligament.  OPERATING SURGEON:  Katy Fitch. Pantera Winterrowd, MD  ASSISTANT:  Marveen Reeks Dasnoit, PA-C  ANESTHESIA:  General by LMA.  SUPERVISING ANESTHESIOLOGIST:  Janetta Hora. Gelene Mink, MD  INDICATIONS:  Taylor [name] is a 61 year old woman referred for evaluation and management of hand numbness.  Clinical examination revealed evidence of carpal tunnel syndrome on the right. Electrodiagnostic studies confirmed very significant median neuropathy. Due to failure to respond to nonoperative measures, she is brought to the operating room at this time for release of her right transverse carpal ligament.  Preoperatively, she was interviewed in the holding area.  Questions regarding the anticipated surgery were invited and answered in detail. She had an Anesthesia consult with Dr. Gelene Mink and accepted general anesthesia by LMA technique.  After informed consent, she is brought to the operating room at this time.  PROCEDURE:  Taylor [name] was brought to room 3 at the [facility] and placed in supine position on the operating table.  Following the induction of general anesthesia by LMA technique under Dr. Thornton Dales direct supervision, her right arm was prepped with Betadine soap and solution and sterilely draped.  Following exsanguination of the right arm with Esmarch bandage, arterial tourniquet on proximal right brachium was inflated to 220 mmHg.  A routine  surgical time-out was accomplished followed by a 2 cm incision in the line of the ring finger and the palm.  Subcutaneous tissues were carefully divided revealing the palmar fascia.  This was split longitudinally to reveal the common sensory branch of the median nerve.  These were followed back to the median nerve proper which was separated from the transverse carpal ligament with a Insurance risk surveyor.  Scissors were used to release the ulnar border of the transverse carpal ligament extending into the distal forearm.  This widely opened the carpal canal.  No masses or other predicaments were noted.  Bleeding points along the margin of the released ligament were electrocauterized with bipolar forceps.  The wound was then repaired with intradermal 3-0 Prolene suture.  A compressive dressing was applied with a volar plaster splint, maintaining the wrist in 10 degrees of dorsiflexion.  For aftercare, Ms. [name] is provided a prescription for hydrocodone 5 mg 1 p.o. q. 4-6 h. p.r.n. pain 20 tablets without refill.     Katy Fitch Tamika Shropshire, M.D.     RVS/MEDQ  D:  08/15/2011  T:  08/15/2011  Job:  [id number]

## 2011-08-15 NOTE — Anesthesia Procedure Notes (Signed)
Procedure Name: LMA Insertion Date/Time: 08/15/2011 7:50 AM Performed by: Verlan Friends Pre-anesthesia Checklist: Patient identified, Emergency Drugs available, Suction available, Patient being monitored and Timeout performed Patient Re-evaluated:Patient Re-evaluated prior to inductionOxygen Delivery Method: Circle System Utilized Preoxygenation: Pre-oxygenation with 100% oxygen Intubation Type: IV induction Ventilation: Mask ventilation without difficulty LMA: LMA inserted LMA Size: 3.0 Number of attempts: 1 Airway Equipment and Method: bite block (soft gauze bite gaurd used) Placement Confirmation: positive ETCO2 Tube secured with: Tape Dental Injury: Teeth and Oropharynx as per pre-operative assessment  Comments: Upper denture removed and placed in cup to PACU with patient

## 2011-08-15 NOTE — Anesthesia Preprocedure Evaluation (Signed)
Anesthesia Evaluation  Patient identified by MRN, date of birth, ID band Patient awake    Reviewed: Allergy & Precautions, H&P , NPO status , Patient's Chart, lab work & pertinent test results, reviewed documented beta blocker date and time   Airway Mallampati: II TM Distance: >3 FB Neck ROM: full    Dental   Pulmonary COPDCurrent Smoker,          Cardiovascular negative cardio ROS      Neuro/Psych  Neuromuscular disease negative psych ROS   GI/Hepatic negative GI ROS, Neg liver ROS, GERD-  Medicated and Controlled,  Endo/Other  negative endocrine ROS  Renal/GU negative Renal ROS  negative genitourinary   Musculoskeletal   Abdominal   Peds  Hematology negative hematology ROS (+)   Anesthesia Other Findings See surgeon's H&P   Reproductive/Obstetrics negative OB ROS                           Anesthesia Physical Anesthesia Plan  ASA: III  Anesthesia Plan: General   Post-op Pain Management:    Induction: Intravenous  Airway Management Planned: LMA  Additional Equipment:   Intra-op Plan:   Post-operative Plan: Extubation in OR  Informed Consent: I have reviewed the patients History and Physical, chart, labs and discussed the procedure including the risks, benefits and alternatives for the proposed anesthesia with the patient or authorized representative who has indicated his/her understanding and acceptance.   Dental Advisory Given  Plan Discussed with: CRNA and Surgeon  Anesthesia Plan Comments:         Anesthesia Quick Evaluation

## 2011-08-15 NOTE — Brief Op Note (Signed)
08/15/2011  8:24 AM  PATIENT:  Taylor [name]  61 y.o. female  PRE-OPERATIVE DIAGNOSIS:  right cts  POST-OPERATIVE DIAGNOSIS:  right cts  PROCEDURE:  Procedure(s) (LRB): CARPAL TUNNEL RELEASE (Right)  SURGEON:  Surgeon(s) and Role:    * Wyn Forster., MD - Primary  PHYSICIAN ASSISTANT:   ASSISTANTS: Mallory Shirk.A-C   ANESTHESIA:   none  EBL:  Total I/O In: 300 [I.V.:300] Out: -   BLOOD ADMINISTERED:none  DRAINS: none   LOCAL MEDICATIONS USED:  XYLOCAINE   SPECIMEN:  No Specimen  DISPOSITION OF SPECIMEN:  N/A  COUNTS:  YES  TOURNIQUET:   Total Tourniquet Time Documented: Upper Arm (Right) - 8 minutes  DICTATION: .Other Dictation: Dictation Number [id number]  PLAN OF CARE: Discharge to home after PACU  PATIENT DISPOSITION:  PACU - hemodynamically stable.

## 2011-08-15 NOTE — Anesthesia Postprocedure Evaluation (Signed)
Anesthesia Post Note  Patient: Taylor [name]  Procedure(s) Performed: Procedure(s) (LRB): CARPAL TUNNEL RELEASE (Right)  Anesthesia type: General  Patient location: PACU  Post pain: Pain level controlled  Post assessment: Patient's Cardiovascular Status Stable  Last Vitals:  Filed Vitals:   08/15/11 0919  BP: 126/60  Pulse:   Temp: 36.4 C  Resp: 16    Post vital signs: Reviewed and stable  Level of consciousness: alert  Complications: No apparent anesthesia complications

## 2011-08-15 NOTE — Discharge Instructions (Signed)

## 2011-08-20 ENCOUNTER — Encounter (HOSPITAL_BASED_OUTPATIENT_CLINIC_OR_DEPARTMENT_OTHER): Payer: Self-pay | Admitting: Orthopedic Surgery

## 2011-10-29 ENCOUNTER — Encounter (HOSPITAL_COMMUNITY): Payer: Self-pay | Admitting: Pharmacy Technician

## 2011-11-06 ENCOUNTER — Encounter (HOSPITAL_COMMUNITY): Payer: Self-pay

## 2011-11-06 ENCOUNTER — Encounter (HOSPITAL_COMMUNITY)
Admission: RE | Admit: 2011-11-06 | Discharge: 2011-11-06 | Disposition: A | Discharge status: Home / Self Care | Payer: Managed Care, Other (non HMO) | Source: Ambulatory Visit | Attending: Podiatry | Admitting: Podiatry

## 2011-11-06 LAB — BASIC METABOLIC PANEL
BUN: 8 mg/dL (ref 6–23)
CO2: 33 mEq/L — ABNORMAL HIGH (ref 19–32)
Calcium: 9.7 mg/dL (ref 8.4–10.5)
GFR calc non Af Amer: >90 mL/min (ref >90)
Glucose, Bld: 96 mg/dL (ref 70–99)

## 2011-11-06 NOTE — Patient Instructions (Addendum)
M20 Taylor [name]  11/06/2011   Your procedure is scheduled on:  Thursday, 11/14/11  Report to [facility] [place] [facility].  Call this number if you have problems the morning of surgery: [phone]   Remember:   Do not eat food:After Midnight.  May have clear liquids:until Midnight .  Clear liquids include soda, tea, black coffee, apple or grape juice, broth.  Take these medicines the morning of surgery with A SIP OF WATER: hycodan, promethazine, and antivert    Do not wear jewelry, make-up or nail polish.  Do not wear lotions, powders, or perfumes. You may wear deodorant.  Do not shave 48 hours prior to surgery. Men may shave face and neck.  Do not bring valuables to the hospital.  Contacts, dentures or bridgework may not be worn into surgery.  Leave suitcase in the car. After surgery it may be brought to your room.  For patients admitted to the hospital, checkout time is 11:00 AM the day of discharge.   Patients discharged the day of surgery will not be allowed to drive home.  Name and phone number of your driver: family  Special Instructions: CHG Shower Use Special Wash: 1/2 bottle night before surgery and 1/2 bottle morning of surgery.   Please read over the following fact sheets that you were given: Pain Booklet, MRSA Information, Surgical Site Infection Prevention, Anesthesia Post-op Instructions and Care and Recovery After Surgery  Morton's Neuroma in Sports  (Interdigital Plantar Neuroma) Morton's neuroma is a condition of the nervous system that results in pain or loss of feeling in the toes. The disease is caused by the bones of the foot squeezing the nerve that runs between two toes (interdigital nerve). The third and fourth toes are most likely to be affected by this disease. SYMPTOMS   Tingling, numbness, burning, or electric shocks in the front of the foot, often involving the third and fourth toes, although it may involve any other pair of toes.   Pain and tenderness in the  front of the foot, that gets worse when walking.   Pain that gets worse when pressure is applied to the foot (wearing shoes).   Severe pain in the front of the foot, when standing on the front of the foot (on tiptoes), such as with running, jumping, pivoting, or dancing.  CAUSES  Morton's neuroma is caused by swelling of the nerve between two toes. This swelling causes the nerve to be pinched between the bones of the foot. RISK INCREASES WITH:  Recurring foot or ankle injuries.   Poor fitting or worn shoes, with minimal padding and shock absorbers.   Loose ligaments of the foot, causing thickening of the nerve.   Poor foot strength and flexibility.  PREVENTION  Warm up and stretch properly before activity.   Maintain physical fitness:   Foot and ankle flexibility.   Muscle strength and endurance.   Cardiovascular fitness.   Wear properly fitted and padded shoes.   Wear arch supports (orthotics), when needed.  PROGNOSIS  If treated properly, Morton's neuroma can usually be cured with non-surgical treatment. For certain cases, surgery may be needed. RELATED COMPLICATIONS  Permanent numbness and pain in the foot.   Inability to participate in athletics, because of pain.  TREATMENT Treatment first involves stopping any activities that make the symptoms worse. The use of ice and medicine will help reduce pain and inflammation. Wearing shoes with a wide toe box, and an orthotic arch support or metatarsal bar, may also  reduce pain. Your caregiver may give you a corticosteroid injection, to further reduce inflammation. If non-surgical treatment is unsuccessful, surgery may be needed. Surgery to fix Morton's neuroma is often performed as an outpatient procedure, meaning you can go home the same day as the surgery. The procedure involves removing the source of pressure on the nerve. If it is necessary to remove the nerve, you can expect persistent numbness. MEDICATION  If pain  medicine is needed, nonsteroidal anti-inflammatory medicines (aspirin and ibuprofen), or other minor pain relievers (acetaminophen), are often advised.   Do not take pain medicine for 7 days before surgery.   Prescription pain relievers are usually prescribed only after surgery. Use only as directed and only as much as you need.   Corticosteroid injections are used in extreme cases, to reduce inflammation. These injections should be done only if necessary, because they may be given only a limited number of times.  HEAT AND COLD  Cold treatment (icing) should be applied for 10 to 15 minutes every 2 to 3 hours for inflammation and pain, and immediately after activity that aggravates your symptoms. Use ice packs or an ice massage.   Heat treatment may be used before performing stretching and strengthening activities prescribed by your caregiver, physical therapist, or athletic trainer. Use a heat pack or a warm water soak.  SEEK MEDICAL CARE IF:   Symptoms get worse or do not improve in 2 weeks, despite treatment.   After surgery you develop increasing pain, swelling, redness, increased warmth, bleeding, drainage of fluids, or fever.   New, unexplained symptoms develop. (Drugs used in treatment may produce side effects.)  Document Released: 01/16/2005 Document Revised: 02/28/2011 Document Reviewed: 06/23/2008 [facility] Patient Information 2012 [place], [place].orton's Neuroma Neuralgia (nerve pain) or neuroma (benign [non-cancerous] nerve tumor) may develop on any interdigital nerve. The interdigital nerves (nerves between digits) of the foot travel beneath and between the metatarsals (long bones of the fore foot) and pass the nerve endings to the toes. The third interdigital is a common place for a small neuroma to form called Morton's neuroma. Another nerve to be affected commonly is the fourth interdigital nerve. This would be in approximately in the area of the base or ball under the bottom of  your fourth toe. This condition occurs more commonly in women and is usually on one side. It is usually first noticed by pain radiating (spreading) to the ball of the foot or to the toes. CAUSES The cause of interdigital neuralgia may be from low grade repetitive trauma (damage caused by an accident) as in activities causing a repeated pounding of the foot (running, jumping etc.). It is also caused by improper footwear or recent loss of the fatty padding on the bottom of the foot. TREATMENT  The condition often resolves (goes away) simply with decreasing activity if that is thought to be the cause. Proper shoes are beneficial. Orthotics (special foot support aids) such as a metatarsal bar are often beneficial. This condition usually responds to conservative therapy, however if surgery is necessary it usually brings complete relief. HOME CARE INSTRUCTIONS   Apply ice to the area of soreness for 15 to 20 minutes, 3 to 4 times per day, while awake for the first 2 days. Put ice in a plastic bag and place a towel between the bag of ice and your skin.   Only take over-the-counter or prescription medicines for pain, discomfort, or fever as directed by your caregiver.  MAKE SURE YOU:   Understand these  instructions.   Will watch your condition.   Will get help right away if you are not doing well or get worse.  Document Released: 06/17/2000 Document Revised: 02/28/2011 Document Reviewed: 03/11/2005 [facility] Patient Information 2012 [place], [place].

## 2011-11-07 ENCOUNTER — Other Ambulatory Visit (HOSPITAL_COMMUNITY): Payer: Self-pay

## 2011-11-14 ENCOUNTER — Encounter (HOSPITAL_COMMUNITY)
Admission: RE | Disposition: A | Discharge status: Home / Self Care | Payer: Self-pay | Source: Ambulatory Visit | Attending: Podiatry

## 2011-11-14 ENCOUNTER — Ambulatory Visit (HOSPITAL_COMMUNITY): Payer: Managed Care, Other (non HMO) | Admitting: Anesthesiology

## 2011-11-14 ENCOUNTER — Encounter (HOSPITAL_COMMUNITY): Payer: Self-pay | Admitting: Anesthesiology

## 2011-11-14 ENCOUNTER — Ambulatory Visit (HOSPITAL_COMMUNITY)
Admission: RE | Admit: 2011-11-14 | Discharge: 2011-11-14 | Disposition: A | Discharge status: Home / Self Care | Payer: Managed Care, Other (non HMO) | Source: Ambulatory Visit | Attending: Podiatry | Admitting: Podiatry

## 2011-11-14 ENCOUNTER — Encounter (HOSPITAL_COMMUNITY): Payer: Self-pay | Admitting: *Deleted

## 2011-11-14 DIAGNOSIS — J4489 Other specified chronic obstructive pulmonary disease: Secondary | ICD-10-CM | POA: Insufficient documentation

## 2011-11-14 DIAGNOSIS — Z01812 Encounter for preprocedural laboratory examination: Secondary | ICD-10-CM | POA: Insufficient documentation

## 2011-11-14 DIAGNOSIS — D3613 Benign neoplasm of peripheral nerves and autonomic nervous system of lower limb, including hip: Secondary | ICD-10-CM

## 2011-11-14 DIAGNOSIS — G5760 Lesion of plantar nerve, unspecified lower limb: Secondary | ICD-10-CM | POA: Insufficient documentation

## 2011-11-14 DIAGNOSIS — J449 Chronic obstructive pulmonary disease, unspecified: Secondary | ICD-10-CM | POA: Insufficient documentation

## 2011-11-14 HISTORY — PX: EXCISION MORTON'S NEUROMA: SHX5013

## 2011-11-14 SURGERY — EXCISION, MORTON'S NEUROMA
Anesthesia: Monitor Anesthesia Care | Site: Foot | Laterality: Right | Wound class: Clean

## 2011-11-14 MED ORDER — MIDAZOLAM HCL 2 MG/2ML IJ SOLN
1.0000 mg | INTRAMUSCULAR | Status: DC | PRN
  Administered 2011-11-14: 2 mg via INTRAVENOUS

## 2011-11-14 MED ORDER — LIDOCAINE HCL (PF) 1 % IJ SOLN
INTRAMUSCULAR | Status: AC
  Filled 2011-11-14: qty 30

## 2011-11-14 MED ORDER — FENTANYL CITRATE 0.05 MG/ML IJ SOLN
INTRAMUSCULAR | Status: DC | PRN
  Administered 2011-11-14 (×4): 25 ug via INTRAVENOUS

## 2011-11-14 MED ORDER — LACTATED RINGERS IV SOLN
INTRAVENOUS | Status: DC
  Administered 2011-11-14: 1000 mL via INTRAVENOUS

## 2011-11-14 MED ORDER — BUPIVACAINE HCL (PF) 0.5 % IJ SOLN
INTRAMUSCULAR | Status: DC | PRN
  Administered 2011-11-14: 15 mL

## 2011-11-14 MED ORDER — 0.9 % SODIUM CHLORIDE (POUR BTL) OPTIME
TOPICAL | Status: DC | PRN
  Administered 2011-11-14: 1000 mL

## 2011-11-14 MED ORDER — PROPOFOL 10 MG/ML IV EMUL
INTRAVENOUS | Status: DC | PRN
  Administered 2011-11-14: 100 ug/kg/min via INTRAVENOUS

## 2011-11-14 MED ORDER — ONDANSETRON HCL 4 MG/2ML IJ SOLN: 4.0000 mg | Freq: Once | INTRAMUSCULAR | Status: DC | PRN

## 2011-11-14 MED ORDER — CEFAZOLIN SODIUM-DEXTROSE 2-3 GM-% IV SOLR
INTRAVENOUS | Status: AC
  Filled 2011-11-14: qty 50

## 2011-11-14 MED ORDER — BUPIVACAINE HCL (PF) 0.5 % IJ SOLN
INTRAMUSCULAR | Status: AC
  Filled 2011-11-14: qty 30

## 2011-11-14 MED ORDER — CEFAZOLIN SODIUM-DEXTROSE 2-3 GM-% IV SOLR
2.0000 g | Freq: Once | INTRAVENOUS | Status: AC
  Administered 2011-11-14: 2 g via INTRAVENOUS

## 2011-11-14 MED ORDER — MIDAZOLAM HCL 2 MG/2ML IJ SOLN
INTRAMUSCULAR | Status: AC
  Filled 2011-11-14: qty 2

## 2011-11-14 MED ORDER — FENTANYL CITRATE 0.05 MG/ML IJ SOLN: 25.0000 ug | INTRAMUSCULAR | Status: DC | PRN

## 2011-11-14 MED ORDER — LIDOCAINE HCL (CARDIAC) 10 MG/ML IV SOLN
INTRAVENOUS | Status: DC | PRN
  Administered 2011-11-14: 20 mg via INTRAVENOUS

## 2011-11-14 MED ORDER — LIDOCAINE HCL (PF) 1 % IJ SOLN
INTRAMUSCULAR | Status: AC
  Filled 2011-11-14: qty 5

## 2011-11-14 MED ORDER — PROPOFOL 10 MG/ML IV EMUL
INTRAVENOUS | Status: AC
  Filled 2011-11-14: qty 20

## 2011-11-14 MED ORDER — FENTANYL CITRATE 0.05 MG/ML IJ SOLN
INTRAMUSCULAR | Status: AC
  Filled 2011-11-14: qty 2

## 2011-11-14 MED ORDER — LIDOCAINE HCL (PF) 1 % IJ SOLN
INTRAMUSCULAR | Status: DC | PRN
  Administered 2011-11-14: 6 mL
  Administered 2011-11-14: 2 mL

## 2011-11-14 SURGICAL SUPPLY — 52 items
APL SKNCLS STERI-STRIP NONHPOA (GAUZE/BANDAGES/DRESSINGS) ×1
BAG HAMPER (MISCELLANEOUS) ×2 IMPLANT
BANDAGE CONFORM 2  STR LF (GAUZE/BANDAGES/DRESSINGS) ×1 IMPLANT
BANDAGE ELASTIC 4 VELCRO NS (GAUZE/BANDAGES/DRESSINGS) ×2 IMPLANT
BANDAGE ESMARK 4X12 BL STRL LF (DISPOSABLE) ×1 IMPLANT
BANDAGE GAUZE ELAST BULKY 4 IN (GAUZE/BANDAGES/DRESSINGS) ×2 IMPLANT
BENZOIN TINCTURE PRP APPL 2/3 (GAUZE/BANDAGES/DRESSINGS) ×2 IMPLANT
BLADE AVERAGE 25X9 (BLADE) ×2 IMPLANT
BLADE SURG 15 STRL LF DISP TIS (BLADE) ×1 IMPLANT
BLADE SURG 15 STRL SS (BLADE) ×2
BNDG CMPR 12X4 ELC STRL LF (DISPOSABLE) ×1
BNDG ESMARK 4X12 BLUE STRL LF (DISPOSABLE) ×2
CHLORAPREP W/TINT 26ML (MISCELLANEOUS) ×2 IMPLANT
CLOTH BEACON ORANGE TIMEOUT ST (SAFETY) ×2 IMPLANT
COVER LIGHT HANDLE STERIS (MISCELLANEOUS) ×4 IMPLANT
CUFF TOURN SGL LL 12 (TOURNIQUET CUFF) ×1 IMPLANT
CUFF TOURNIQUET SINGLE 18IN (TOURNIQUET CUFF) IMPLANT
DECANTER SPIKE VIAL GLASS SM (MISCELLANEOUS) ×2 IMPLANT
DRAPE OEC MINIVIEW 54X84 (DRAPES) ×2 IMPLANT
DRSG ADAPTIC 3X8 NADH LF (GAUZE/BANDAGES/DRESSINGS) ×2 IMPLANT
DURA STEPPER LG (CAST SUPPLIES) IMPLANT
DURA STEPPER MED (CAST SUPPLIES) IMPLANT
DURA STEPPER SML (CAST SUPPLIES) IMPLANT
DURA STEPPER XL (SOFTGOODS) IMPLANT
ELECT REM PT RETURN 9FT ADLT (ELECTROSURGICAL) ×2
ELECTRODE REM PT RTRN 9FT ADLT (ELECTROSURGICAL) ×1 IMPLANT
GLOVE BIO SURGEON STRL SZ7.5 (GLOVE) ×2 IMPLANT
GLOVE BIOGEL PI IND STRL 7.0 (GLOVE) IMPLANT
GLOVE BIOGEL PI INDICATOR 7.0 (GLOVE) ×3
GLOVE EXAM NITRILE MD LF STRL (GLOVE) ×1 IMPLANT
GLOVE SS BIOGEL STRL SZ 6.5 (GLOVE) IMPLANT
GLOVE SUPERSENSE BIOGEL SZ 6.5 (GLOVE) ×3
GOWN STRL REIN XL XLG (GOWN DISPOSABLE) ×4 IMPLANT
K-WIRE 6 (WIRE)
KIT ROOM TURNOVER APOR (KITS) ×2 IMPLANT
KWIRE 6 (WIRE) IMPLANT
MARKER SKIN DUAL TIP RULER LAB (MISCELLANEOUS) ×2 IMPLANT
NDL HYPO 18GX1.5 BLUNT FILL (NEEDLE) ×1 IMPLANT
NDL HYPO 27GX1-1/4 (NEEDLE) ×4 IMPLANT
NEEDLE HYPO 18GX1.5 BLUNT FILL (NEEDLE) ×2 IMPLANT
NEEDLE HYPO 27GX1-1/4 (NEEDLE) ×8 IMPLANT
NS IRRIG 1000ML POUR BTL (IV SOLUTION) ×2 IMPLANT
PACK BASIC LIMB (CUSTOM PROCEDURE TRAY) ×2 IMPLANT
PAD ARMBOARD 7.5X6 YLW CONV (MISCELLANEOUS) ×2 IMPLANT
PAD TELFA 3X4 1S STER (GAUZE/BANDAGES/DRESSINGS) ×1 IMPLANT
SET BASIN LINEN APH (SET/KITS/TRAYS/PACK) ×2 IMPLANT
SPONGE GAUZE 4X4 12PLY (GAUZE/BANDAGES/DRESSINGS) ×2 IMPLANT
STRIP CLOSURE SKIN 1/2X4 (GAUZE/BANDAGES/DRESSINGS) ×3 IMPLANT
SUT BONE WAX W31G (SUTURE) ×2 IMPLANT
SUT VIC AB 4-0 PS2 27 (SUTURE) ×2 IMPLANT
SYR CONTROL 10ML LL (SYRINGE) ×6 IMPLANT
TOWEL OR 17X26 4PK STRL BLUE (TOWEL DISPOSABLE) ×2 IMPLANT

## 2011-11-14 NOTE — Anesthesia Preprocedure Evaluation (Signed)
Anesthesia Evaluation  Patient identified by MRN, date of birth, ID band Patient awake    Reviewed: Allergy & Precautions, H&P , NPO status , Patient's Chart, lab work & pertinent test results, reviewed documented beta blocker date and time   History of Anesthesia Complications Negative for: history of anesthetic complications  Airway Mallampati: I TM Distance: >3 FB Neck ROM: full    Dental  (+) Edentulous Upper and Edentulous Lower   Pulmonary COPDCurrent Smoker,    Pulmonary exam normal       Cardiovascular negative cardio ROS  Rhythm:Regular     Neuro/Psych  Neuromuscular disease negative psych ROS   GI/Hepatic negative GI ROS, Neg liver ROS, GERD-  Medicated and Controlled,  Endo/Other  negative endocrine ROS  Renal/GU negative Renal ROS  negative genitourinary   Musculoskeletal   Abdominal   Peds  Hematology negative hematology ROS (+)   Anesthesia Other Findings See surgeon's H&P   Reproductive/Obstetrics negative OB ROS                           Anesthesia Physical Anesthesia Plan  ASA: III  Anesthesia Plan: MAC   Post-op Pain Management:    Induction: Intravenous  Airway Management Planned: Nasal Cannula  Additional Equipment:   Intra-op Plan:   Post-operative Plan:   Informed Consent: I have reviewed the patients History and Physical, chart, labs and discussed the procedure including the risks, benefits and alternatives for the proposed anesthesia with the patient or authorized representative who has indicated his/her understanding and acceptance.     Plan Discussed with:   Anesthesia Plan Comments:         Anesthesia Quick Evaluation

## 2011-11-14 NOTE — Anesthesia Procedure Notes (Signed)
Procedure Name: MAC Date/Time: 11/14/2011 7:36 AM Performed by: Franco Nones Pre-anesthesia Checklist: Patient identified, Emergency Drugs available, Suction available, Timeout performed and Patient being monitored Patient Re-evaluated:Patient Re-evaluated prior to inductionOxygen Delivery Method: Non-rebreather mask

## 2011-11-14 NOTE — Progress Notes (Addendum)
Post op shoe fitted to right foot. Ice pack behind right knee and on top of right foot.

## 2011-11-14 NOTE — Anesthesia Postprocedure Evaluation (Signed)
Anesthesia Post Note  Patient: Taylor [name]  Procedure(s) Performed: Procedure(s) (LRB): EXCISION MORTON'S NEUROMA (Right)  Anesthesia type: MAC  Patient location: PACU  Post pain: Pain level controlled  Post assessment: Post-op Vital signs reviewed, Patient's Cardiovascular Status Stable, Respiratory Function Stable, Patent Airway, No signs of Nausea or vomiting and Pain level controlled  Last Vitals:  Filed Vitals:   11/14/11 0831  BP: 125/62  Pulse: 65  Temp: 36.8 C  Resp: 18    Post vital signs: Reviewed and stable  Level of consciousness: awake and alert   Complications: No apparent anesthesia complications

## 2011-11-14 NOTE — H&P (Signed)
HISTORY AND PHYSICAL INTERVAL NOTE:  11/14/2011  7:27 AM  Taylor [name]  has presented today for surgery, with the diagnosis of neuroma 2nd interspace right foot.  The various methods of treatment have been discussed with the patient.  No guarantees were given.  After consideration of risks, benefits and other options for treatment, the patient has consented to surgery.  I have reviewed the patients' chart and labs.    Patient Vitals for the past 24 hrs:  BP Temp Temp src Pulse Resp SpO2  11/14/11 0719 - - - - - 88 %  11/14/11 0716 - - - - - 88 %  11/14/11 0715 136/69 mmHg - - - 22  90 %  11/14/11 0700 115/65 mmHg - - - 24  92 %  11/14/11 0645 130/61 mmHg - - - 20  94 %  11/14/11 0632 126/63 mmHg 98.1 F (36.7 C) Oral 78  17  97 %    A history and physical examination was performed in my office on 11/06/2011.  The patient was reexamined.  There have been no changes to this history and physical examination.  Dallas Schimke, DPM

## 2011-11-14 NOTE — Brief Op Note (Signed)
BRIEF OPERATIVE NOTE  SURGEON:   Dallas Schimke, DPM  OR STAFF:   Cyndie Chime, RN - Circulator Sherri Sear, CST - Scrub Person Lennox Pippins, RN - RN First Assistant   PREOPERATIVE DIAGNOSIS:   Neuroma 2nd interspace right foot  POSTOPERATIVE DIAGNOSIS: Same  PROCEDURE: Excision of neuroma 2nd interspace right foot  ANESTHESIA:  Monitor Anesthesia Care   HEMOSTASIS:   Pneumatic ankle tourniquet set at 250 mmHg  ESTIMATED BLOOD LOSS:   Minimal (<5 cc)  MATERIALS USED:  None  INJECTABLES: 0.5% Marcaine plain and 1% Lidocaine plain  PATHOLOGY:   Neuroma 2nd interspace of the right foot  COMPLICATIONS:   None  INDICATIONS:  Pain in 2nd interspace of the right foot.    DICTATION:  Dictated

## 2011-11-14 NOTE — Transfer of Care (Signed)
Immediate Anesthesia Transfer of Care Note  Patient: Taylor [name]  Procedure(s) Performed: Procedure(s) (LRB): EXCISION MORTON'S NEUROMA (Right)  Patient Location: PACU  Anesthesia Type: MAC  Level of Consciousness: awake  Airway & Oxygen Therapy: Patient Spontanous Breathing. Nasal cannula  Post-op Assessment: Report given to PACU RN, Post -op Vital signs reviewed and stable and Patient moving all extremities  Post vital signs: Reviewed and stable  Complications: No apparent anesthesia complications

## 2011-11-15 ENCOUNTER — Encounter (HOSPITAL_COMMUNITY): Payer: Self-pay | Admitting: Podiatry

## 2011-11-15 NOTE — Op Note (Signed)
NAME:  Taylor [name], Taylor [name]               ACCOUNT NO.:  [id number]  MEDICAL RECORD NO.:  [id number]  LOCATION:  [facility]                          FACILITY:  [facility]  PHYSICIAN:  B. Theola Sequin, MD   DATE OF BIRTH:  16-Sep-1950  DATE OF PROCEDURE: DATE OF DISCHARGE:  11/14/2011                              OPERATIVE REPORT   SURGEON:  B. Theola Sequin, MD  ASSISTANT:  Valetta Close, RN  PREOPERATIVE DIAGNOSIS:  Neuroma 2nd interspace, right foot.  POSTOPERATIVE DIAGNOSIS:  Neuroma 2nd interspace, right foot.  PROCEDURE:  Excision of neuroma 2nd interspace, right foot.  ANESTHESIA:  MAC with local.  HEMOSTASIS:  Pneumatic ankle tourniquet at 250 mmHg.  ESTIMATED BLOOD LOSS:  Minimal.  MATERIALS:  None.  INJECTABLE:  0.5% Marcaine plain and 1% lidocaine plain.  PATHOLOGY:  Neuroma 2nd interspace of the right foot.  COMPLICATIONS:  None.  INDICATIONS FOR PROCEDURE:  Pain in 2nd interspace of the right foot, nonresponsive to conservative care.  PROCEDURE IN DETAIL:  The patient was brought to the operating room, placed on the operative table in supine position.  The pneumatic ankle tourniquet was placed about the patient's right ankle.  The foot was then anesthetized using 0.5% Marcaine plain.  The foot was scrubbed, prepped, and draped in usual sterile manner.  The limb was then elevated, exsanguinated, and the pneumatic ankle tourniquet was inflated to 250 mmHg. Attention was directed to the dorsal aspect of the 2nd interspace of the right foot where a linear longitudinal incision was made.  Dissection was continued deep down to the level of the neuroma, which was identified and was traced proximally to a normal appearing neural tissue and distally to the bifurcation into the proper digital nerves to the 2nd and 3rd digits of the right foot.  The proper digital nerves were transected and the neuroma freed of all soft tissue attachments.  The block was augmented with 1% lidocaine  plain.  The common proper digital nerve was transected at the proximal aspect of the interspace.  The soft tissue was passed from the operative field and sent to pathology for evaluation.  The wound was inspected and noted to be free of any remaining abnormal soft tissue pathology.  The wound was irrigated with copious amounts of sterile irrigant.  Subcutaneous structures were reapproximated using 4-0 Vicryl in a simple suture technique.  The skin was reapproximated using 4-0 Vicryl in a running subcuticular manner. The incision was reinforced with Steri-Strips.  A sterile compressive dressing was applied to the right foot.  The pneumatic ankle tourniquet deflated and a prompt hyperemic response was noted to all digits of the right foot.          ______________________________ B. Theola Sequin, MD     BIM/MEDQ  D:  11/14/2011  T:  11/14/2011  Job:  [id number]

## 2012-06-06 IMAGING — CR DG CERVICAL SPINE COMPLETE 4+V
5 series · 5 of 5 positions shown · non-contrast
Comparison: None.

CLINICAL DATA: Head trauma, neck stiffness

CERVICAL SPINE - COMPLETE 4+ VIEW

[view not recorded (1 of 5)]
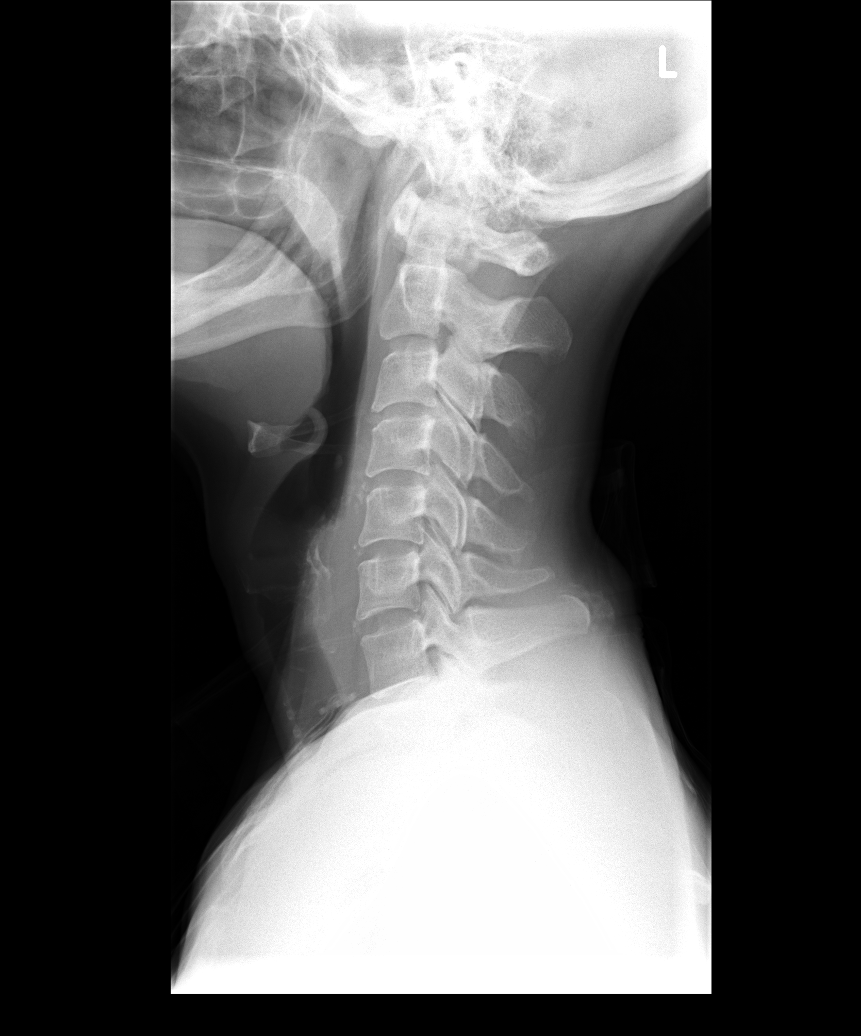

[view not recorded (2 of 5)]
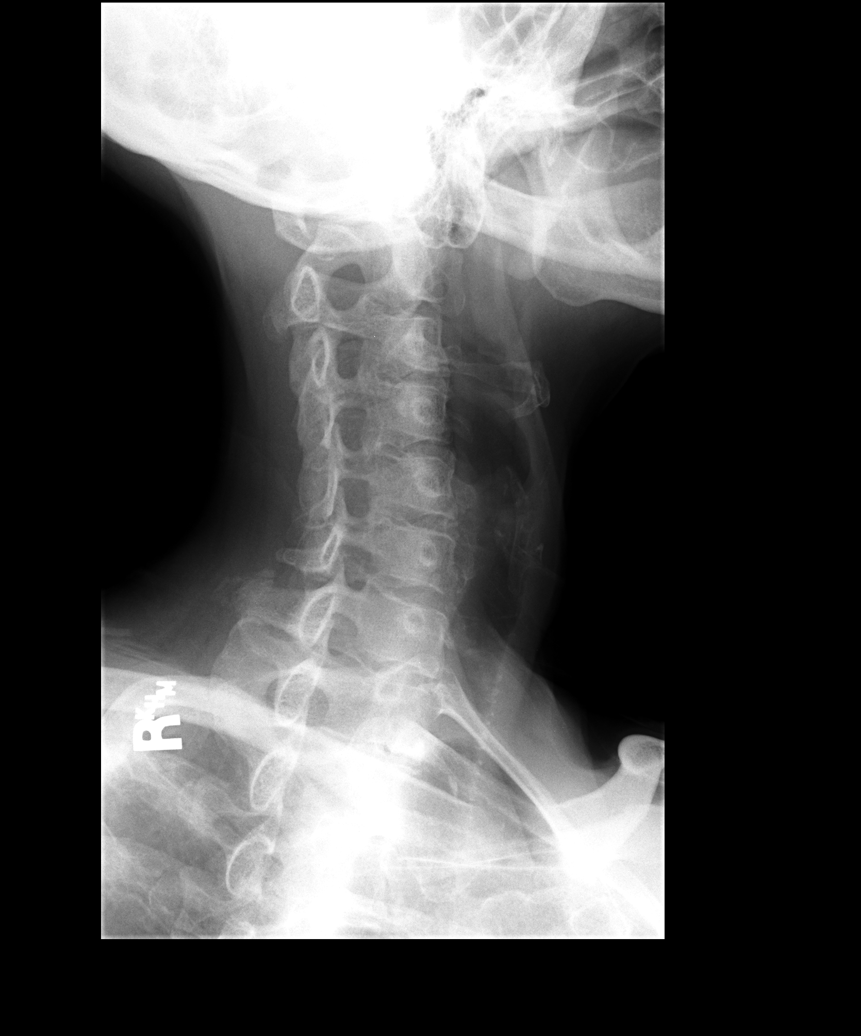

[view not recorded (3 of 5)]
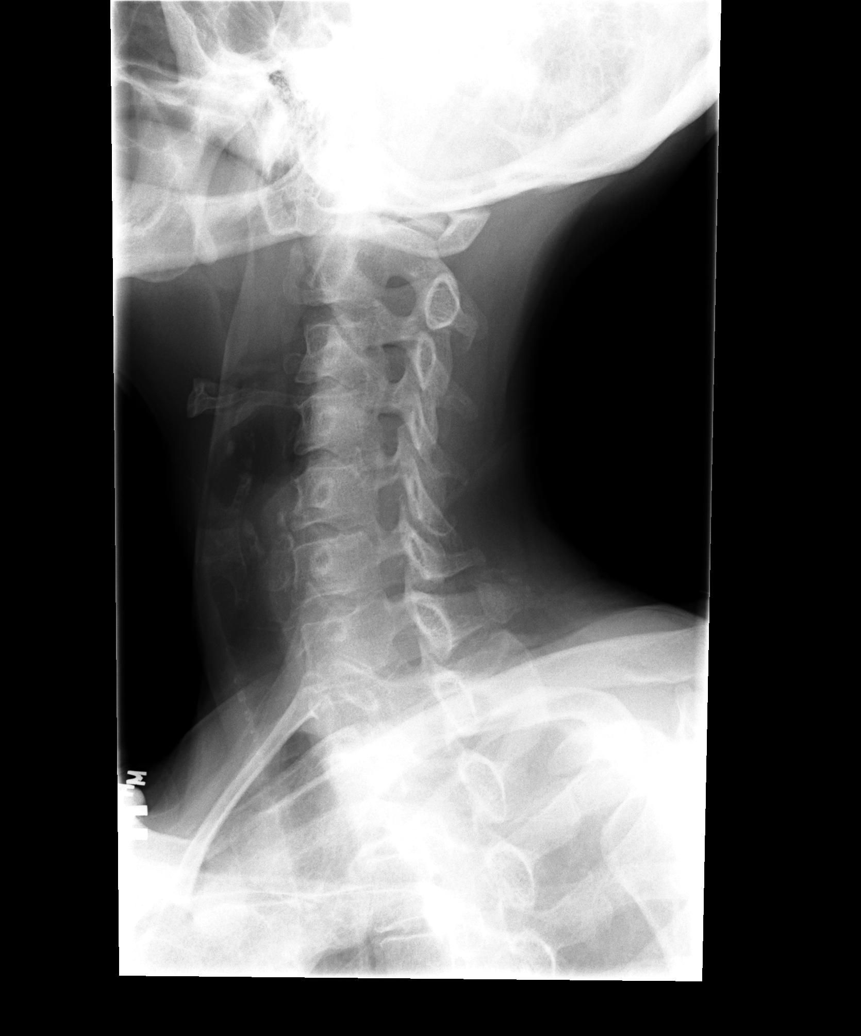

[view not recorded (4 of 5)]
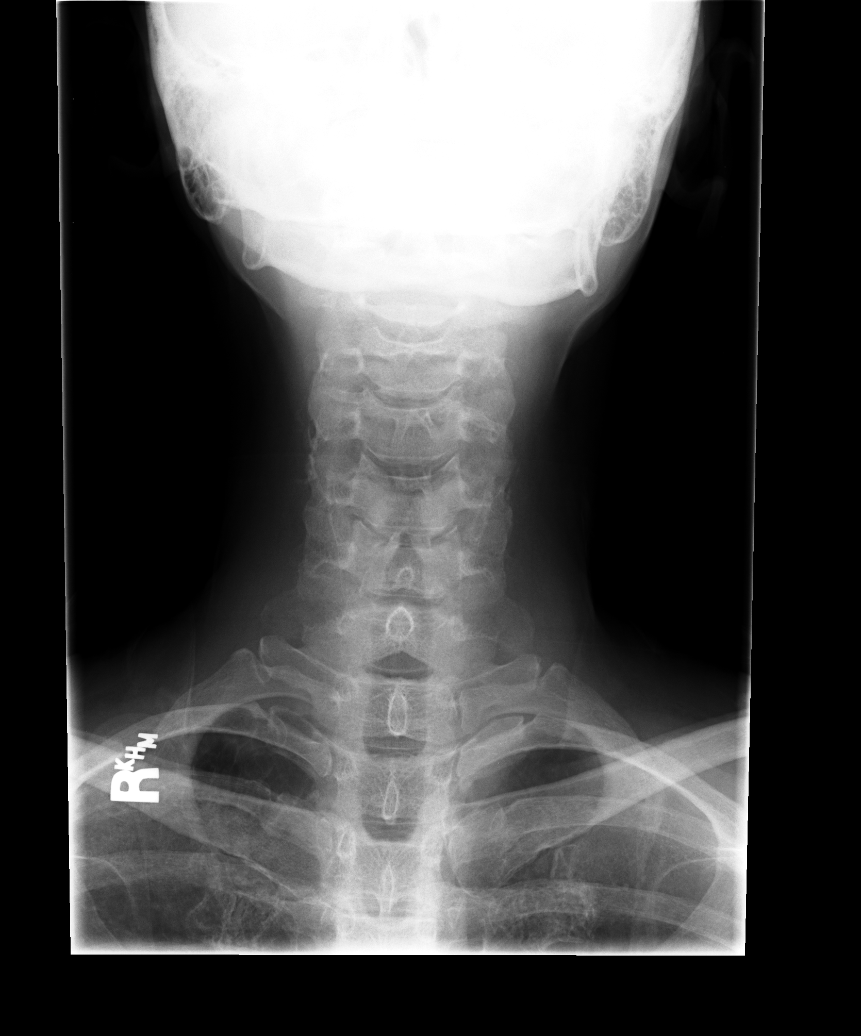

[view not recorded (5 of 5)]
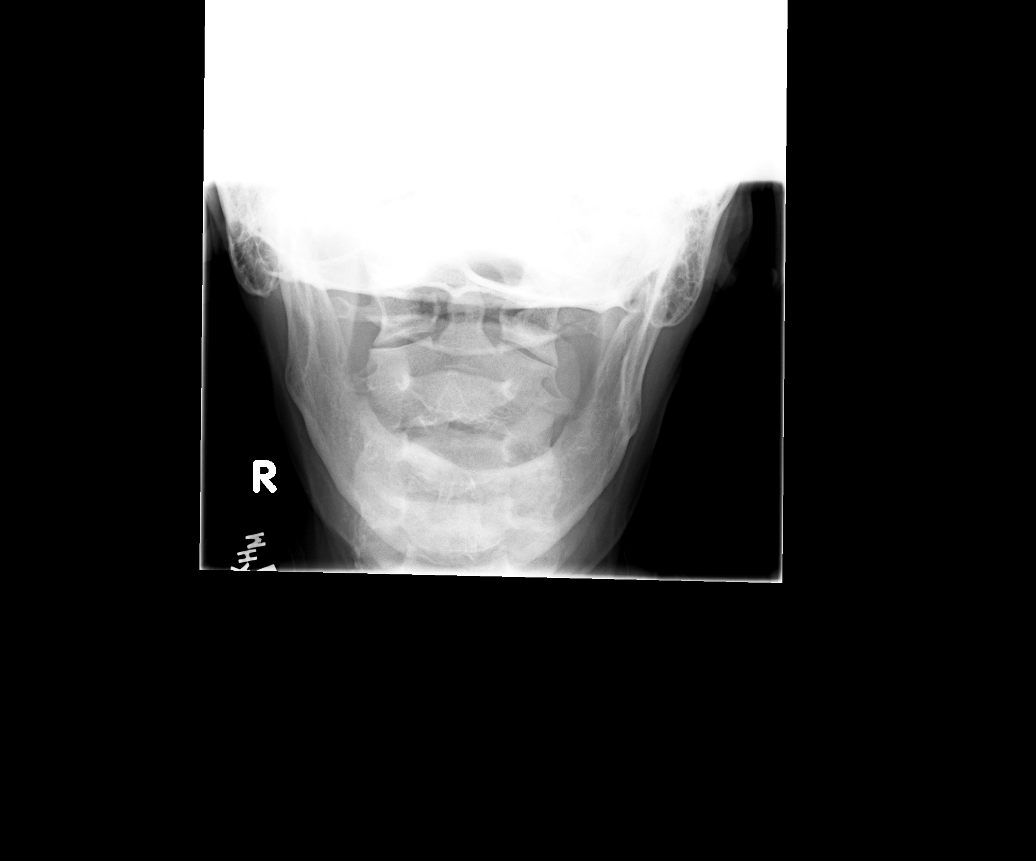

[5 of 5 positions shown; findings below may reference images not displayed]

FINDINGS: There is no evidence of acute fracture.  Cervical
alignment is maintained.  Mild multilevel degenerative changes are
seen throughout the cervical spine.  There is no resulting
foraminal stenosis.  Soft tissues are normal.
IMPRESSION: No acute findings.

## 2012-09-08 ENCOUNTER — Other Ambulatory Visit: Payer: Self-pay

## 2012-09-08 DIAGNOSIS — Z1231 Encounter for screening mammogram for malignant neoplasm of breast: Secondary | ICD-10-CM

## 2012-09-16 ENCOUNTER — Ambulatory Visit
Admission: RE | Admit: 2012-09-16 | Discharge: 2012-09-16 | Disposition: A | Discharge status: Home / Self Care | Payer: Managed Care, Other (non HMO) | Source: Ambulatory Visit

## 2012-09-16 DIAGNOSIS — Z1231 Encounter for screening mammogram for malignant neoplasm of breast: Secondary | ICD-10-CM

## 2012-10-28 ENCOUNTER — Other Ambulatory Visit: Payer: Self-pay | Admitting: *Deleted

## 2012-11-16 ENCOUNTER — Encounter: Payer: Self-pay | Admitting: Vascular Surgery

## 2012-11-17 ENCOUNTER — Encounter: Payer: Self-pay | Admitting: Vascular Surgery

## 2012-11-17 ENCOUNTER — Other Ambulatory Visit (INDEPENDENT_AMBULATORY_CARE_PROVIDER_SITE_OTHER): Payer: Managed Care, Other (non HMO) | Admitting: Vascular Surgery

## 2012-11-17 ENCOUNTER — Ambulatory Visit (INDEPENDENT_AMBULATORY_CARE_PROVIDER_SITE_OTHER): Payer: Managed Care, Other (non HMO) | Admitting: Vascular Surgery

## 2012-11-17 DIAGNOSIS — I6529 Occlusion and stenosis of unspecified carotid artery: Secondary | ICD-10-CM

## 2012-11-17 NOTE — Progress Notes (Signed)
[facility]   Patient name: Taylor [name] MRN: [id number] DOB: 1951/01/28 Sex: female   Referred by: Anne Fu  Reason for referral:  Chief Complaint  Patient presents with  . New Evaluation    carotid stenosis- Dr. Anne Fu    HISTORY OF PRESENT ILLNESS: Patient presents today for evaluation of asymptomatic extracranial cerebrovascular occlusive disease. She specifically denies any prior focal neurologic deficits to include amaurosis fugax transient ischemic attack or stroke. He underwent a duplex at an outlying vascular lab do have these results for evaluation. This suggested 70% right internal carotid artery stenosis but no significant left internal carotid artery stenosis. She is seeing [place] for further discussion.  Past Medical History  Diagnosis Date  . Allergy     seasonal allergies  . Wears glasses   . GERD (gastroesophageal reflux disease)   . Wears dentures   . COPD (chronic obstructive pulmonary disease)   . Arthritis   . Carpal tunnel syndrome   . Carotid artery occlusion     Past Surgical History  Procedure Laterality Date  . Knee surgery  2012    left, cartilage repair  . Abdominal hysterectomy  1974    still has ovaries  . Knee surgery  2005    left knee, cartilage repair  . Knee surgery  2012    left, cartilage repair  . Foot neuroma surgery  2008    right  . Colonoscopy  2006    [place], repeat 2012  . Hernia repair  2000    right inguinal  . Carpal tunnel release  08/15/2011    Procedure: CARPAL TUNNEL RELEASE;  Surgeon: Wyn Forster., MD;  Location: [facility];  Service: Orthopedics;  Laterality: Right;  . Excision morton's neuroma  11/14/2011    Procedure: EXCISION MORTON'S NEUROMA;  Surgeon: Dallas Schimke, DPM;  Location: [facility];  Service: Orthopedics;  Laterality: Right;  Excision of Neuroma 2nd Interspace Right Foot  . Cyst excision      vaginal X3    History   Social History  .  Marital Status: Widowed    Spouse Name: N/A    Number of Children: N/A  . Years of Education: N/A   Occupational History  . Not on file.   Social History Main Topics  . Smoking status: Current Every Day Smoker -- 0.50 packs/day for 41 years    Types: Cigarettes  . Smokeless tobacco: Never Used  . Alcohol Use: Yes     Comment: rare  . Drug Use: No  . Sexual Activity: Not on file     Comment: widowed; has boyfriend; works for [employer], exercise - walk, hiking, kayaking   Other Topics Concern  . Not on file   Social History Narrative  . No narrative on file    Family History  Problem Relation Age of Onset  . Aneurysm Mother   . Heart disease Father 80    died of MI  . Heart attack Father   . Cancer Brother     unknown  . Liver disease Brother     1 brother died of cirrhosis    Allergies as of 11/17/2012 - Review Complete 11/17/2012  Allergen Reaction Noted  . Contrast media [iodinated diagnostic agents]  08/27/2010  . Tetracyclines & related Nausea And Vomiting 08/15/2011    Current Outpatient Prescriptions on File Prior to Visit  Medication Sig Dispense Refill  . meclizine (ANTIVERT) 12.5 MG tablet Take 12.5 mg  by mouth 3 (three) times daily as needed. For dizziness      . mometasone (NASONEX) 50 MCG/ACT nasal spray Place 2 sprays into the nose daily.  17 g  2   No current facility-administered medications on file prior to visit.     REVIEW OF SYSTEMS:  Positives indicated with an "X"  CARDIOVASCULAR:  [ ]  chest pain   [ ]  chest pressure   [ ]  palpitations   [ ]  orthopnea   [ ]  dyspnea on exertion   [ ]  claudication   [ ]  rest pain   [ ]  DVT   [ ]  phlebitis PULMONARY:   [ ]  productive cough   [ ]  asthma   [ ]  wheezing NEUROLOGIC:   [ ]  weakness  [ ]  paresthesias  [ ]  aphasia  [ ]  amaurosis  [ ]  dizziness HEMATOLOGIC:   [ ]  bleeding problems   [ ]  clotting disorders MUSCULOSKELETAL:  [ ]  joint pain   [ ]  joint swelling GASTROINTESTINAL: [ ]   blood in  stool  [ ]   hematemesis GENITOURINARY:  [ ]   dysuria  [ ]   hematuria PSYCHIATRIC:  [ ]  history of major depression INTEGUMENTARY:  [ ]  rashes  [ ]  ulcers CONSTITUTIONAL:  [ ]  fever   [ ]  chills  PHYSICAL EXAMINATION:  General: The patient is a well-nourished female, in no acute distress. Vital signs are BP 147/71  Pulse 97  Ht 5' 3.5" (1.613 m)  Wt 136 lb (61.689 kg)  BMI 23.71 kg/m2  SpO2 99% Pulmonary: There is a good air exchange bilaterally without wheezing or rales. Abdomen: Soft and non-tender with normal pitch bowel sounds. Musculoskeletal: There are no major deformities.  There is no significant extremity pain. Neurologic: No focal weakness or paresthesias are detected, Skin: There are no ulcer or rashes noted. Psychiatric: The patient has normal affect. Cardiovascular: There is a regular rate and rhythm without significant murmur appreciated. Pulse status 2+ radial pulses bilaterally, 2+ dorsalis pedis pulses bilaterally Carotid arteries with soft left bruit  [facility] Vascular Lab Studies:  Ordered and Independently Reviewed I explained to the patient and her family present we currently perform endarterectomy based on duplex If we can see internal carotid artery normal above the tight stenosis. She went underwent repeat carotid duplex in our office and this did show a lesser degree of stenosis that was interpreted as outlined lab. Her right internal carotid arteries predicted at 40-59% and no significant stenosis in her left internal carotid artery  Impression and Plan:  Moderate right and no significant left internal carotid artery stenosis. I discussed this at length with the patient and her family present. I explained that we do have a lower level of stenosis and predicted by the other lab. I explained even at the velocities in the LAD that we would recommend 6 month followup only. I discussed symptoms of carotid disease with the patient she knows to report immediately should  she develop any focal deficits. Otherwise we will see her in 6 months with a repeat carotid duplex    EARLY, TODD [facility] Office: [phone]        hgh

## 2012-11-18 NOTE — Addendum Note (Signed)
Addended by: Sharee Pimple on: 11/18/2012 11:02 AM   Modules accepted: Orders

## 2013-01-20 ENCOUNTER — Ambulatory Visit: Payer: Managed Care, Other (non HMO) | Admitting: Cardiology

## 2013-04-22 ENCOUNTER — Other Ambulatory Visit: Payer: Self-pay | Admitting: Vascular Surgery

## 2013-04-22 DIAGNOSIS — I6529 Occlusion and stenosis of unspecified carotid artery: Secondary | ICD-10-CM

## 2013-05-25 ENCOUNTER — Other Ambulatory Visit (HOSPITAL_COMMUNITY): Payer: Managed Care, Other (non HMO)

## 2013-05-25 ENCOUNTER — Ambulatory Visit: Payer: Managed Care, Other (non HMO) | Admitting: Vascular Surgery

## 2013-09-15 ENCOUNTER — Encounter (HOSPITAL_COMMUNITY): Payer: Self-pay | Admitting: Pharmacy Technician

## 2013-09-23 NOTE — Patient Instructions (Signed)
Your procedure is scheduled on: 10/04/2013  Report to [facility] at  11 AM.  Call this number if you have problems the morning of surgery: [phone]   Do not eat food or drink liquids :After Midnight.      Take these medicines the morning of surgery with A SIP OF WATER: none   Do not wear jewelry, make-up or nail polish.  Do not wear lotions, powders, or perfumes.  Do not shave 48 hours prior to surgery.  Do not bring valuables to the hospital.  Contacts, dentures or bridgework may not be worn into surgery.  Leave suitcase in the car. After surgery it may be brought to your room.  For patients admitted to the hospital, checkout time is 11:00 AM the day of discharge.   Patients discharged the day of surgery will not be allowed to drive home.  :     Please read over the following fact sheets that you were given: Coughing and Deep Breathing, Surgical Site Infection Prevention, Anesthesia Post-op Instructions and Care and Recovery After Surgery    Cataract A cataract is a clouding of the lens of the eye. When a lens becomes cloudy, vision is reduced based on the degree and nature of the clouding. Many cataracts reduce vision to some degree. Some cataracts make people more near-sighted as they develop. Other cataracts increase glare. Cataracts that are ignored and become worse can sometimes look white. The white color can be seen through the pupil. CAUSES   Aging. However, cataracts may occur at any age, even in newborns.   Certain drugs.   Trauma to the eye.   Certain diseases such as diabetes.   Specific eye diseases such as chronic inflammation inside the eye or a sudden attack of a rare form of glaucoma.   Inherited or acquired medical problems.  SYMPTOMS   Gradual, progressive drop in vision in the affected eye.   Severe, rapid visual loss. This most often happens when trauma is the cause.  DIAGNOSIS  To detect a cataract, an eye doctor examines the lens. Cataracts are  best diagnosed with an exam of the eyes with the pupils enlarged (dilated) by drops.  TREATMENT  For an early cataract, vision may improve by using different eyeglasses or stronger lighting. If that does not help your vision, surgery is the only effective treatment. A cataract needs to be surgically removed when vision loss interferes with your everyday activities, such as driving, reading, or watching TV. A cataract may also have to be removed if it prevents examination or treatment of another eye problem. Surgery removes the cloudy lens and usually replaces it with a substitute lens (intraocular lens, IOL).  At a time when both you and your doctor agree, the cataract will be surgically removed. If you have cataracts in both eyes, only one is usually removed at a time. This allows the operated eye to heal and be out of danger from any possible problems after surgery (such as infection or poor wound healing). In rare cases, a cataract may be doing damage to your eye. In these cases, your caregiver may advise surgical removal right away. The vast majority of people who have cataract surgery have better vision afterward. HOME CARE INSTRUCTIONS  If you are not planning surgery, you may be asked to do the following:  Use different eyeglasses.   Use stronger or brighter lighting.   Ask your eye doctor about reducing your medicine dose or changing medicines if it is  thought that a medicine caused your cataract. Changing medicines does not make the cataract go away on its own.   Become familiar with your surroundings. Poor vision can lead to injury. Avoid bumping into things on the affected side. You are at a higher risk for tripping or falling.   Exercise extreme care when driving or operating machinery.   Wear sunglasses if you are sensitive to bright light or experiencing problems with glare.  SEEK IMMEDIATE MEDICAL CARE IF:   You have a worsening or sudden vision loss.   You notice redness,  swelling, or increasing pain in the eye.   You have a fever.  Document Released: 03/11/2005 Document Revised: 02/28/2011 Document Reviewed: 11/02/2010 [facility] Patient Information 2012 [place].PATIENT INSTRUCTIONS POST-ANESTHESIA  IMMEDIATELY FOLLOWING SURGERY:  Do not drive or operate machinery for the first twenty four hours after surgery.  Do not make any important decisions for twenty four hours after surgery or while taking narcotic pain medications or sedatives.  If you develop intractable nausea and vomiting or a severe headache please notify your doctor immediately.  FOLLOW-UP:  Please make an appointment with your surgeon as instructed. You do not need to follow up with anesthesia unless specifically instructed to do so.  WOUND CARE INSTRUCTIONS (if applicable):  Keep a dry clean dressing on the anesthesia/puncture wound site if there is drainage.  Once the wound has quit draining you may leave it open to air.  Generally you should leave the bandage intact for twenty four hours unless there is drainage.  If the epidural site drains for more than 36-48 hours please call the anesthesia department.  QUESTIONS?:  Please feel free to call your physician or the hospital operator if you have any questions, and they will be happy to assist you.

## 2013-09-27 ENCOUNTER — Encounter (HOSPITAL_COMMUNITY): Payer: Self-pay

## 2013-09-27 ENCOUNTER — Other Ambulatory Visit: Payer: Self-pay

## 2013-09-27 ENCOUNTER — Encounter (HOSPITAL_COMMUNITY)
Admission: RE | Admit: 2013-09-27 | Discharge: 2013-09-27 | Disposition: A | Discharge status: Home / Self Care | Payer: Managed Care, Other (non HMO) | Source: Ambulatory Visit | Attending: Ophthalmology | Admitting: Ophthalmology

## 2013-09-27 DIAGNOSIS — Z01818 Encounter for other preprocedural examination: Secondary | ICD-10-CM | POA: Insufficient documentation

## 2013-09-27 DIAGNOSIS — Z01810 Encounter for preprocedural cardiovascular examination: Secondary | ICD-10-CM | POA: Insufficient documentation

## 2013-09-27 LAB — BASIC METABOLIC PANEL
Anion gap: 8 (ref 5–15)
BUN: 10 mg/dL (ref 6–23)
CO2: 32 mEq/L (ref 19–32)
Calcium: 9.3 mg/dL (ref 8.4–10.5)
Chloride: 103 mEq/L (ref 96–112)
Creatinine, Ser: 0.64 mg/dL (ref 0.50–1.10)
GFR calc Af Amer: >90 mL/min (ref >90)
GLUCOSE: 93 mg/dL (ref 70–99)
POTASSIUM: 4.4 meq/L (ref 3.7–5.3)
Sodium: 143 mEq/L (ref 137–147)

## 2013-09-27 LAB — HEMOGLOBIN AND HEMATOCRIT, BLOOD
HCT: 38.3 % (ref 36.0–46.0)
Hemoglobin: 13.1 g/dL (ref 12.0–15.0)

## 2013-09-27 NOTE — Pre-Procedure Instructions (Signed)
Patient given iniformation to sign up for my chart at home.

## 2013-10-01 MED ORDER — LIDOCAINE HCL 3.5 % OP GEL
OPHTHALMIC | Status: AC
  Filled 2013-10-01: qty 1

## 2013-10-01 MED ORDER — PHENYLEPHRINE HCL 2.5 % OP SOLN
OPHTHALMIC | Status: AC
  Filled 2013-10-01: qty 15

## 2013-10-01 MED ORDER — CYCLOPENTOLATE-PHENYLEPHRINE OP SOLN OPTIME - NO CHARGE
OPHTHALMIC | Status: AC
  Filled 2013-10-01: qty 2

## 2013-10-01 MED ORDER — NEOMYCIN-POLYMYXIN-DEXAMETH 3.5-10000-0.1 OP SUSP
OPHTHALMIC | Status: AC
  Filled 2013-10-01: qty 5

## 2013-10-01 MED ORDER — LIDOCAINE HCL (PF) 1 % IJ SOLN
INTRAMUSCULAR | Status: AC
  Filled 2013-10-01: qty 2

## 2013-10-01 MED ORDER — TETRACAINE HCL 0.5 % OP SOLN
OPHTHALMIC | Status: AC
  Filled 2013-10-01: qty 2

## 2013-10-04 ENCOUNTER — Encounter (HOSPITAL_COMMUNITY): Payer: Managed Care, Other (non HMO) | Admitting: Anesthesiology

## 2013-10-04 ENCOUNTER — Ambulatory Visit (HOSPITAL_COMMUNITY): Payer: Managed Care, Other (non HMO) | Admitting: Anesthesiology

## 2013-10-04 ENCOUNTER — Ambulatory Visit (HOSPITAL_COMMUNITY)
Admission: RE | Admit: 2013-10-04 | Discharge: 2013-10-04 | Disposition: A | Discharge status: Home / Self Care | Payer: Managed Care, Other (non HMO) | Source: Ambulatory Visit | Attending: Ophthalmology | Admitting: Ophthalmology

## 2013-10-04 ENCOUNTER — Encounter (HOSPITAL_COMMUNITY)
Admission: RE | Disposition: A | Discharge status: Home / Self Care | Payer: Self-pay | Source: Ambulatory Visit | Attending: Ophthalmology

## 2013-10-04 ENCOUNTER — Encounter (HOSPITAL_COMMUNITY): Payer: Self-pay | Admitting: Ophthalmology

## 2013-10-04 DIAGNOSIS — H2510 Age-related nuclear cataract, unspecified eye: Secondary | ICD-10-CM | POA: Insufficient documentation

## 2013-10-04 DIAGNOSIS — F17200 Nicotine dependence, unspecified, uncomplicated: Secondary | ICD-10-CM | POA: Insufficient documentation

## 2013-10-04 DIAGNOSIS — J4489 Other specified chronic obstructive pulmonary disease: Secondary | ICD-10-CM | POA: Insufficient documentation

## 2013-10-04 DIAGNOSIS — J449 Chronic obstructive pulmonary disease, unspecified: Secondary | ICD-10-CM | POA: Insufficient documentation

## 2013-10-04 HISTORY — PX: CATARACT EXTRACTION W/PHACO: SHX586

## 2013-10-04 SURGERY — PHACOEMULSIFICATION, CATARACT, WITH IOL INSERTION
Anesthesia: Monitor Anesthesia Care | Site: Eye | Laterality: Right

## 2013-10-04 MED ORDER — TRYPAN BLUE 0.06 % OP SOLN
OPHTHALMIC | Status: AC
Start: 2013-10-04 — End: 2013-10-04
  Filled 2013-10-04: qty 0.5

## 2013-10-04 MED ORDER — TETRACAINE HCL 0.5 % OP SOLN
1.0000 [drp] | OPHTHALMIC | Status: AC
  Administered 2013-10-04 (×3): 1 [drp] via OPHTHALMIC

## 2013-10-04 MED ORDER — LIDOCAINE 3.5 % OP GEL OPTIME - NO CHARGE
OPHTHALMIC | Status: DC | PRN
  Administered 2013-10-04: 1 [drp] via OPHTHALMIC

## 2013-10-04 MED ORDER — PROVISC 10 MG/ML IO SOLN
INTRAOCULAR | Status: DC | PRN
  Administered 2013-10-04: 0.85 mL via INTRAOCULAR

## 2013-10-04 MED ORDER — LIDOCAINE HCL (PF) 1 % IJ SOLN
INTRAMUSCULAR | Status: DC | PRN
  Administered 2013-10-04: .5 mL

## 2013-10-04 MED ORDER — EPINEPHRINE HCL 1 MG/ML IJ SOLN
INTRAOCULAR | Status: DC | PRN
  Administered 2013-10-04: 08:00:00

## 2013-10-04 MED ORDER — FENTANYL CITRATE 0.05 MG/ML IJ SOLN
25.0000 ug | INTRAMUSCULAR | Status: AC
  Administered 2013-10-04 (×2): 25 ug via INTRAVENOUS
  Filled 2013-10-04: qty 2

## 2013-10-04 MED ORDER — LACTATED RINGERS IV SOLN
INTRAVENOUS | Status: DC
  Administered 2013-10-04: 1000 mL via INTRAVENOUS

## 2013-10-04 MED ORDER — CYCLOPENTOLATE-PHENYLEPHRINE 0.2-1 % OP SOLN
1.0000 [drp] | OPHTHALMIC | Status: AC
  Administered 2013-10-04 (×3): 1 [drp] via OPHTHALMIC

## 2013-10-04 MED ORDER — NEOMYCIN-POLYMYXIN-DEXAMETH 3.5-10000-0.1 OP SUSP
OPHTHALMIC | Status: DC | PRN
  Administered 2013-10-04: 2 [drp] via OPHTHALMIC

## 2013-10-04 MED ORDER — BSS IO SOLN
INTRAOCULAR | Status: DC | PRN
  Administered 2013-10-04: 15 mL via INTRAOCULAR

## 2013-10-04 MED ORDER — MIDAZOLAM HCL 2 MG/2ML IJ SOLN
1.0000 mg | INTRAMUSCULAR | Status: DC | PRN
Start: 2013-10-04 — End: 2013-10-04
  Administered 2013-10-04 (×2): 1 mg via INTRAVENOUS

## 2013-10-04 MED ORDER — EPINEPHRINE HCL 1 MG/ML IJ SOLN
INTRAMUSCULAR | Status: AC
  Filled 2013-10-04: qty 1

## 2013-10-04 MED ORDER — PHENYLEPHRINE HCL 2.5 % OP SOLN
1.0000 [drp] | OPHTHALMIC | Status: AC
  Administered 2013-10-04 (×3): 1 [drp] via OPHTHALMIC

## 2013-10-04 MED ORDER — POVIDONE-IODINE 5 % OP SOLN
OPHTHALMIC | Status: DC | PRN
  Administered 2013-10-04: 1 via OPHTHALMIC

## 2013-10-04 MED ORDER — LIDOCAINE HCL 3.5 % OP GEL
1.0000 "application " | Freq: Once | OPHTHALMIC | Status: AC
  Administered 2013-10-04: 1 via OPHTHALMIC

## 2013-10-04 MED ORDER — MIDAZOLAM HCL 2 MG/2ML IJ SOLN
INTRAMUSCULAR | Status: AC
  Filled 2013-10-04: qty 2

## 2013-10-04 MED ORDER — LIDOCAINE HCL (PF) 1 % IJ SOLN
INTRAMUSCULAR | Status: AC
  Filled 2013-10-04: qty 2

## 2013-10-04 SURGICAL SUPPLY — 32 items

## 2013-10-04 NOTE — Anesthesia Postprocedure Evaluation (Signed)
  Anesthesia Post-op Note  Patient: Taylor [name]  Procedure(s) Performed: Procedure(s) with comments: CATARACT EXTRACTION PHACO AND INTRAOCULAR LENS PLACEMENT (IOC) (Right) - CDE 12.10  Patient Location: Short Stay  Anesthesia Type:MAC  Level of Consciousness: awake, alert  and oriented  Airway and Oxygen Therapy: Patient Spontanous Breathing  Post-op Pain: none  Post-op Assessment: Post-op Vital signs reviewed, Patient's Cardiovascular Status Stable, Respiratory Function Stable, Patent Airway and No signs of Nausea or vomiting  Post-op Vital Signs: Reviewed and stable  Last Vitals:  Filed Vitals:   10/04/13 0710  BP: 136/60  Temp:   Resp: 46    Complications: No apparent anesthesia complications

## 2013-10-04 NOTE — Op Note (Signed)
Date of Admission: 10/04/2013  Date of Surgery: 10/04/2013   Pre-Op Dx: Cataract Right Eye  Post-Op Dx: Nuclear Cataract Right  Eye,  Dx Code 366.16  Surgeon: Tonny Branch, M.D.  Assistants: None  Anesthesia: Topical with MAC  Indications: Painless, progressive loss of vision with compromise of daily activities.  Surgery: Cataract Extraction with Intraocular lens Implant Right Eye  Discription: The patient had dilating drops and viscous lidocaine placed into the Right eye in the pre-op holding area. After transfer to the operating room, a time out was performed. The patient was then prepped and draped. Beginning with a 72 degree blade a paracentesis port was made at the surgeon's 2 o'clock position. The anterior chamber was then filled with 1% non-preserved lidocaine. This was followed by filling the anterior chamber with Provisc.  A 2.38mm keratome blade was used to make a clear corneal incision at the temporal limbus.  A bent cystatome needle was used to create a continuous tear capsulotomy. Hydrodissection was performed with balanced salt solution on a Fine canula. The lens nucleus was then removed using the phacoemulsification handpiece. Residual cortex was removed with the I&A handpiece. The anterior chamber and capsular bag were refilled with Provisc. A posterior chamber intraocular lens was placed into the capsular bag with it's injector. The implant was positioned with the Kuglan hook. The Provisc was then removed from the anterior chamber and capsular bag with the I&A handpiece. Stromal hydration of the main incision and paracentesis port was performed with BSS on a Fine canula. The wounds were tested for leak which was negative. The patient tolerated the procedure well. There were no operative complications. The patient was then transferred to the recovery room in stable condition.  Complications: None  Specimen: None  EBL: None  Prosthetic device: Hoya iSert 250, power 18.5 D, SN  [id number].

## 2013-10-04 NOTE — H&P (Signed)
I have reviewed the H&P, the patient was re-examined, and I have identified no interval changes in medical condition and plan of care since the history and physical of record  

## 2013-10-04 NOTE — Discharge Instructions (Addendum)

## 2013-10-04 NOTE — Anesthesia Preprocedure Evaluation (Signed)
Anesthesia Evaluation  Patient identified by MRN, date of birth, ID band Patient awake    Reviewed: Allergy & Precautions, H&P , NPO status , Patient's Chart, lab work & pertinent test results, reviewed documented beta blocker date and time   History of Anesthesia Complications Negative for: history of anesthetic complications  Airway Mallampati: I TM Distance: >3 FB Neck ROM: full    Dental  (+) Edentulous Upper, Edentulous Lower   Pulmonary COPDCurrent Smoker,    Pulmonary exam normal       Cardiovascular + Peripheral Vascular Disease negative cardio ROS  Rhythm:Regular     Neuro/Psych  Neuromuscular disease negative psych ROS   GI/Hepatic negative GI ROS, Neg liver ROS, GERD-  Medicated and Controlled,  Endo/Other  negative endocrine ROS  Renal/GU negative Renal ROS  negative genitourinary   Musculoskeletal   Abdominal   Peds  Hematology negative hematology ROS (+)   Anesthesia Other Findings See surgeon's H&P   Reproductive/Obstetrics negative OB ROS                           Anesthesia Physical Anesthesia Plan  ASA: III  Anesthesia Plan: MAC   Post-op Pain Management:    Induction: Intravenous  Airway Management Planned: Nasal Cannula  Additional Equipment:   Intra-op Plan:   Post-operative Plan:   Informed Consent: I have reviewed the patients History and Physical, chart, labs and discussed the procedure including the risks, benefits and alternatives for the proposed anesthesia with the patient or authorized representative who has indicated his/her understanding and acceptance.     Plan Discussed with:   Anesthesia Plan Comments:         Anesthesia Quick Evaluation

## 2013-10-04 NOTE — Transfer of Care (Signed)
Immediate Anesthesia Transfer of Care Note  Patient: Taylor [name]  Procedure(s) Performed: Procedure(s) with comments: CATARACT EXTRACTION PHACO AND INTRAOCULAR LENS PLACEMENT (IOC) (Right) - CDE 12.10  Patient Location: Short Stay  Anesthesia Type:MAC  Level of Consciousness: awake  Airway & Oxygen Therapy: Patient Spontanous Breathing  Post-op Assessment: Report given to PACU RN  Post vital signs: Reviewed  Complications: No apparent anesthesia complications

## 2013-10-05 ENCOUNTER — Encounter (HOSPITAL_COMMUNITY): Payer: Self-pay | Admitting: Ophthalmology

## 2013-10-13 ENCOUNTER — Encounter (HOSPITAL_COMMUNITY): Payer: Self-pay | Admitting: Pharmacy Technician

## 2013-10-25 ENCOUNTER — Encounter (HOSPITAL_COMMUNITY)
Admission: RE | Admit: 2013-10-25 | Discharge: 2013-10-25 | Disposition: A | Discharge status: Home / Self Care | Payer: Managed Care, Other (non HMO) | Source: Ambulatory Visit | Attending: Ophthalmology | Admitting: Ophthalmology

## 2013-10-25 MED ORDER — ONDANSETRON HCL 4 MG/2ML IJ SOLN: 4.0000 mg | Freq: Once | INTRAMUSCULAR | Status: AC | PRN

## 2013-10-25 MED ORDER — FENTANYL CITRATE 0.05 MG/ML IJ SOLN: 25.0000 ug | INTRAMUSCULAR | Status: DC | PRN

## 2013-10-27 MED ORDER — CYCLOPENTOLATE-PHENYLEPHRINE OP SOLN OPTIME - NO CHARGE
OPHTHALMIC | Status: AC
  Filled 2013-10-27: qty 2

## 2013-10-27 MED ORDER — NEOMYCIN-POLYMYXIN-DEXAMETH 3.5-10000-0.1 OP SUSP
OPHTHALMIC | Status: AC
  Filled 2013-10-27: qty 5

## 2013-10-27 MED ORDER — LIDOCAINE HCL (PF) 1 % IJ SOLN
INTRAMUSCULAR | Status: AC
  Filled 2013-10-27: qty 2

## 2013-10-27 MED ORDER — PHENYLEPHRINE HCL 2.5 % OP SOLN
OPHTHALMIC | Status: AC
  Filled 2013-10-27: qty 15

## 2013-10-27 MED ORDER — LIDOCAINE HCL 3.5 % OP GEL
OPHTHALMIC | Status: AC
  Filled 2013-10-27: qty 1

## 2013-10-27 MED ORDER — TETRACAINE HCL 0.5 % OP SOLN
OPHTHALMIC | Status: AC
  Filled 2013-10-27: qty 2

## 2013-10-28 ENCOUNTER — Ambulatory Visit (HOSPITAL_COMMUNITY)
Admission: RE | Admit: 2013-10-28 | Discharge: 2013-10-28 | Disposition: A | Discharge status: Home / Self Care | Payer: Managed Care, Other (non HMO) | Source: Ambulatory Visit | Attending: Ophthalmology | Admitting: Ophthalmology

## 2013-10-28 ENCOUNTER — Encounter (HOSPITAL_COMMUNITY)
Admission: RE | Disposition: A | Discharge status: Home / Self Care | Payer: Self-pay | Source: Ambulatory Visit | Attending: Ophthalmology

## 2013-10-28 ENCOUNTER — Ambulatory Visit (HOSPITAL_COMMUNITY): Payer: Managed Care, Other (non HMO) | Admitting: Anesthesiology

## 2013-10-28 ENCOUNTER — Encounter (HOSPITAL_COMMUNITY): Payer: Managed Care, Other (non HMO) | Admitting: Anesthesiology

## 2013-10-28 ENCOUNTER — Encounter (HOSPITAL_COMMUNITY): Payer: Self-pay | Admitting: *Deleted

## 2013-10-28 DIAGNOSIS — K219 Gastro-esophageal reflux disease without esophagitis: Secondary | ICD-10-CM | POA: Insufficient documentation

## 2013-10-28 DIAGNOSIS — J4489 Other specified chronic obstructive pulmonary disease: Secondary | ICD-10-CM | POA: Insufficient documentation

## 2013-10-28 DIAGNOSIS — J449 Chronic obstructive pulmonary disease, unspecified: Secondary | ICD-10-CM | POA: Insufficient documentation

## 2013-10-28 DIAGNOSIS — H2510 Age-related nuclear cataract, unspecified eye: Secondary | ICD-10-CM | POA: Insufficient documentation

## 2013-10-28 DIAGNOSIS — Z79899 Other long term (current) drug therapy: Secondary | ICD-10-CM | POA: Insufficient documentation

## 2013-10-28 DIAGNOSIS — F17200 Nicotine dependence, unspecified, uncomplicated: Secondary | ICD-10-CM | POA: Insufficient documentation

## 2013-10-28 HISTORY — PX: CATARACT EXTRACTION W/PHACO: SHX586

## 2013-10-28 SURGERY — PHACOEMULSIFICATION, CATARACT, WITH IOL INSERTION
Anesthesia: Monitor Anesthesia Care | Site: Eye | Laterality: Left

## 2013-10-28 MED ORDER — POVIDONE-IODINE 5 % OP SOLN
OPHTHALMIC | Status: DC | PRN
  Administered 2013-10-28: 1 via OPHTHALMIC

## 2013-10-28 MED ORDER — FENTANYL CITRATE 0.05 MG/ML IJ SOLN
INTRAMUSCULAR | Status: AC
  Administered 2013-10-28: 25 ug via INTRAVENOUS
  Filled 2013-10-28: qty 2

## 2013-10-28 MED ORDER — EPINEPHRINE HCL 1 MG/ML IJ SOLN
INTRAMUSCULAR | Status: AC
  Filled 2013-10-28: qty 1

## 2013-10-28 MED ORDER — CYCLOPENTOLATE-PHENYLEPHRINE 0.2-1 % OP SOLN
1.0000 [drp] | OPHTHALMIC | Status: AC
  Administered 2013-10-28 (×3): 1 [drp] via OPHTHALMIC

## 2013-10-28 MED ORDER — LIDOCAINE HCL (PF) 1 % IJ SOLN
INTRAMUSCULAR | Status: DC | PRN
  Administered 2013-10-28: .4 mL

## 2013-10-28 MED ORDER — LACTATED RINGERS IV SOLN
INTRAVENOUS | Status: DC
Start: 2013-10-28 — End: 2013-10-28
  Administered 2013-10-28: 08:00:00 via INTRAVENOUS

## 2013-10-28 MED ORDER — FENTANYL CITRATE 0.05 MG/ML IJ SOLN: 25.0000 ug | INTRAMUSCULAR | Status: AC

## 2013-10-28 MED ORDER — NEOMYCIN-POLYMYXIN-DEXAMETH 3.5-10000-0.1 OP SUSP
OPHTHALMIC | Status: DC | PRN
  Administered 2013-10-28: 1 [drp] via OPHTHALMIC

## 2013-10-28 MED ORDER — PROVISC 10 MG/ML IO SOLN
INTRAOCULAR | Status: DC | PRN
  Administered 2013-10-28: 0.85 mL via INTRAOCULAR

## 2013-10-28 MED ORDER — MIDAZOLAM HCL 2 MG/2ML IJ SOLN
1.0000 mg | INTRAMUSCULAR | Status: DC | PRN
  Administered 2013-10-28: 2 mg via INTRAVENOUS

## 2013-10-28 MED ORDER — PHENYLEPHRINE HCL 2.5 % OP SOLN
1.0000 [drp] | OPHTHALMIC | Status: AC
  Administered 2013-10-28 (×3): 1 [drp] via OPHTHALMIC

## 2013-10-28 MED ORDER — LIDOCAINE 3.5 % OP GEL OPTIME - NO CHARGE
OPHTHALMIC | Status: DC | PRN
  Administered 2013-10-28: 1 [drp] via OPHTHALMIC

## 2013-10-28 MED ORDER — BSS IO SOLN
INTRAOCULAR | Status: DC | PRN
  Administered 2013-10-28: 15 mL via INTRAOCULAR

## 2013-10-28 MED ORDER — LIDOCAINE HCL 3.5 % OP GEL
1.0000 "application " | Freq: Once | OPHTHALMIC | Status: AC
  Administered 2013-10-28: 1 via OPHTHALMIC

## 2013-10-28 MED ORDER — EPINEPHRINE HCL 1 MG/ML IJ SOLN
INTRAOCULAR | Status: DC | PRN
  Administered 2013-10-28: 09:00:00

## 2013-10-28 MED ORDER — TETRACAINE HCL 0.5 % OP SOLN
1.0000 [drp] | OPHTHALMIC | Status: AC
  Administered 2013-10-28 (×3): 1 [drp] via OPHTHALMIC

## 2013-10-28 MED ORDER — MIDAZOLAM HCL 5 MG/5ML IJ SOLN
INTRAMUSCULAR | Status: AC
  Filled 2013-10-28: qty 5

## 2013-10-28 SURGICAL SUPPLY — 33 items
CAPSULAR TENSION RING-AMO (OPHTHALMIC RELATED) IMPLANT
CLOTH BEACON ORANGE TIMEOUT ST (SAFETY) ×1 IMPLANT
EYE SHIELD UNIVERSAL CLEAR (GAUZE/BANDAGES/DRESSINGS) ×1 IMPLANT
GLOVE BIO SURGEON STRL SZ 6.5 (GLOVE) IMPLANT
GLOVE BIOGEL PI IND STRL 6.5 (GLOVE) IMPLANT
GLOVE BIOGEL PI IND STRL 7.0 (GLOVE) IMPLANT
GLOVE BIOGEL PI IND STRL 7.5 (GLOVE) IMPLANT
GLOVE BIOGEL PI INDICATOR 6.5 (GLOVE) ×1
GLOVE BIOGEL PI INDICATOR 7.0 (GLOVE)
GLOVE BIOGEL PI INDICATOR 7.5 (GLOVE)
GLOVE ECLIPSE 6.5 STRL STRAW (GLOVE) IMPLANT
GLOVE ECLIPSE 7.0 STRL STRAW (GLOVE) IMPLANT
GLOVE ECLIPSE 7.5 STRL STRAW (GLOVE) IMPLANT
GLOVE EXAM NITRILE LRG STRL (GLOVE) IMPLANT
GLOVE EXAM NITRILE MD LF STRL (GLOVE) ×1 IMPLANT
GLOVE SKINSENSE NS SZ6.5 (GLOVE)
GLOVE SKINSENSE NS SZ7.0 (GLOVE)
GLOVE SKINSENSE STRL SZ6.5 (GLOVE) IMPLANT
GLOVE SKINSENSE STRL SZ7.0 (GLOVE) IMPLANT
KIT VITRECTOMY (OPHTHALMIC RELATED) IMPLANT
PAD ARMBOARD 7.5X6 YLW CONV (MISCELLANEOUS) ×1 IMPLANT
PROC W NO LENS (INTRAOCULAR LENS)
PROC W SPEC LENS (INTRAOCULAR LENS)
PROCESS W NO LENS (INTRAOCULAR LENS) IMPLANT
PROCESS W SPEC LENS (INTRAOCULAR LENS) IMPLANT
RETRACTOR IRIS SIGHTPATH (OPHTHALMIC RELATED) ×1 IMPLANT
RING MALYGIN (MISCELLANEOUS) IMPLANT
SIGHTPATH CAT PROC W REG LENS (Ophthalmic Related) ×2 IMPLANT
SYRINGE LUER LOK 1CC (MISCELLANEOUS) ×1 IMPLANT
TAPE SURG TRANSPORE 1 IN (GAUZE/BANDAGES/DRESSINGS) IMPLANT
TAPE SURGICAL TRANSPORE 1 IN (GAUZE/BANDAGES/DRESSINGS) ×1
VISCOELASTIC ADDITIONAL (OPHTHALMIC RELATED) IMPLANT
WATER STERILE IRR 250ML POUR (IV SOLUTION) ×1 IMPLANT

## 2013-10-28 NOTE — Anesthesia Postprocedure Evaluation (Signed)
  Anesthesia Post-op Note  Patient: Taylor [name]  Procedure(s) Performed: Procedure(s) with comments: CATARACT EXTRACTION PHACO AND INTRAOCULAR LENS PLACEMENT (IOC) (Left) - CDE: 9.10  Patient Location: Short Stay  Anesthesia Type:MAC  Level of Consciousness: awake, alert  and oriented  Airway and Oxygen Therapy: Patient Spontanous Breathing  Post-op Pain: none  Post-op Assessment: Post-op Vital signs reviewed, Patient's Cardiovascular Status Stable, Respiratory Function Stable, Patent Airway and No signs of Nausea or vomiting  Post-op Vital Signs: Reviewed and stable  Last Vitals:  Filed Vitals:   10/28/13 0820  BP: 138/71  Pulse:   Temp:   Resp: 37    Complications: No apparent anesthesia complications

## 2013-10-28 NOTE — Transfer of Care (Signed)
Immediate Anesthesia Transfer of Care Note  Patient: Taylor [name]  Procedure(s) Performed: Procedure(s) with comments: CATARACT EXTRACTION PHACO AND INTRAOCULAR LENS PLACEMENT (IOC) (Left) - CDE: 9.10  Patient Location: Short Stay  Anesthesia Type:MAC  Level of Consciousness: awake  Airway & Oxygen Therapy: Patient Spontanous Breathing  Post-op Assessment: Report given to PACU RN  Post vital signs: Reviewed  Complications: No apparent anesthesia complications

## 2013-10-28 NOTE — Op Note (Signed)
Date of Admission: 10/28/2013  Date of Surgery: 10/28/2013   Pre-Op Dx: Cataract Left Eye  Post-Op Dx: Senile Nuclear  Cataract Left  Eye,  Dx Code 366.16  Surgeon: Tonny Branch, M.D.  Assistants: None  Anesthesia: Topical with MAC  Indications: Painless, progressive loss of vision with compromise of daily activities.  Surgery: Cataract Extraction with Intraocular lens Implant Left Eye  Discription: The patient had dilating drops and viscous lidocaine placed into the Left eye in the pre-op holding area. After transfer to the operating room, a time out was performed. The patient was then prepped and draped. Beginning with a 17 degree blade a paracentesis port was made at the surgeon's 2 o'clock position. The anterior chamber was then filled with 1% non-preserved lidocaine. This was followed by filling the anterior chamber with Provisc.  A 2.7mm keratome blade was used to make a clear corneal incision at the temporal limbus.  A bent cystatome needle was used to create a continuous tear capsulotomy. Hydrodissection was performed with balanced salt solution on a Fine canula. The lens nucleus was then removed using the phacoemulsification handpiece. Residual cortex was removed with the I&A handpiece. The anterior chamber and capsular bag were refilled with Provisc. A posterior chamber intraocular lens was placed into the capsular bag with it's injector. The implant was positioned with the Kuglan hook. The Provisc was then removed from the anterior chamber and capsular bag with the I&A handpiece. Stromal hydration of the main incision and paracentesis port was performed with BSS on a Fine canula. The wounds were tested for leak which was negative. The patient tolerated the procedure well. There were no operative complications. The patient was then transferred to the recovery room in stable condition.  Complications: None  Specimen: None  EBL: None  Prosthetic device: Hoya iSert 250, power 20.0 D, SN  [id number].

## 2013-10-28 NOTE — H&P (Signed)
I have reviewed the H&P, the patient was re-examined, and I have identified no interval changes in medical condition and plan of care since the history and physical of record  

## 2013-10-28 NOTE — Anesthesia Preprocedure Evaluation (Signed)
Anesthesia Evaluation  Patient identified by MRN, date of birth, ID band Patient awake    Reviewed: Allergy & Precautions, H&P , NPO status , Patient's Chart, lab work & pertinent test results, reviewed documented beta blocker date and time   History of Anesthesia Complications Negative for: history of anesthetic complications  Airway Mallampati: I TM Distance: >3 FB Neck ROM: full    Dental  (+) Edentulous Upper, Edentulous Lower   Pulmonary COPDCurrent Smoker,    Pulmonary exam normal       Cardiovascular + Peripheral Vascular Disease negative cardio ROS  Rhythm:Regular     Neuro/Psych  Neuromuscular disease negative psych ROS   GI/Hepatic negative GI ROS, Neg liver ROS, GERD-  Medicated and Controlled,  Endo/Other  negative endocrine ROS  Renal/GU negative Renal ROS  negative genitourinary   Musculoskeletal   Abdominal   Peds  Hematology negative hematology ROS (+)   Anesthesia Other Findings See surgeon's H&P   Reproductive/Obstetrics negative OB ROS                           Anesthesia Physical Anesthesia Plan  ASA: III  Anesthesia Plan: MAC   Post-op Pain Management:    Induction: Intravenous  Airway Management Planned: Nasal Cannula  Additional Equipment:   Intra-op Plan:   Post-operative Plan:   Informed Consent: I have reviewed the patients History and Physical, chart, labs and discussed the procedure including the risks, benefits and alternatives for the proposed anesthesia with the patient or authorized representative who has indicated his/her understanding and acceptance.     Plan Discussed with:   Anesthesia Plan Comments:         Anesthesia Quick Evaluation

## 2013-10-29 ENCOUNTER — Encounter (HOSPITAL_COMMUNITY): Payer: Self-pay | Admitting: Ophthalmology

## 2014-03-25 HISTORY — PX: BACK SURGERY: SHX140

## 2014-05-24 ENCOUNTER — Other Ambulatory Visit (HOSPITAL_COMMUNITY): Payer: Self-pay | Admitting: Orthopedic Surgery

## 2014-05-24 DIAGNOSIS — R200 Anesthesia of skin: Secondary | ICD-10-CM

## 2014-05-24 DIAGNOSIS — R52 Pain, unspecified: Secondary | ICD-10-CM

## 2014-05-27 ENCOUNTER — Ambulatory Visit (HOSPITAL_COMMUNITY): Payer: Managed Care, Other (non HMO)

## 2014-06-24 DIAGNOSIS — Z4789 Encounter for other orthopedic aftercare: Secondary | ICD-10-CM | POA: Diagnosis not present

## 2014-06-28 DIAGNOSIS — Z4789 Encounter for other orthopedic aftercare: Secondary | ICD-10-CM | POA: Diagnosis not present

## 2014-06-30 DIAGNOSIS — Z4789 Encounter for other orthopedic aftercare: Secondary | ICD-10-CM | POA: Diagnosis not present

## 2014-07-05 DIAGNOSIS — Z4789 Encounter for other orthopedic aftercare: Secondary | ICD-10-CM | POA: Diagnosis not present

## 2014-07-07 DIAGNOSIS — Z4789 Encounter for other orthopedic aftercare: Secondary | ICD-10-CM | POA: Diagnosis not present

## 2014-07-13 DIAGNOSIS — Z4789 Encounter for other orthopedic aftercare: Secondary | ICD-10-CM | POA: Diagnosis not present

## 2014-07-22 DIAGNOSIS — M5106 Intervertebral disc disorders with myelopathy, lumbar region: Secondary | ICD-10-CM | POA: Diagnosis not present

## 2014-09-16 ENCOUNTER — Other Ambulatory Visit (HOSPITAL_COMMUNITY): Payer: Self-pay | Admitting: Internal Medicine

## 2014-09-16 DIAGNOSIS — M8580 Other specified disorders of bone density and structure, unspecified site: Secondary | ICD-10-CM

## 2014-09-16 DIAGNOSIS — Z720 Tobacco use: Secondary | ICD-10-CM | POA: Diagnosis not present

## 2014-09-16 DIAGNOSIS — Z716 Tobacco abuse counseling: Secondary | ICD-10-CM | POA: Diagnosis not present

## 2014-09-21 ENCOUNTER — Ambulatory Visit (HOSPITAL_COMMUNITY)
Admission: RE | Admit: 2014-09-21 | Discharge: 2014-09-21 | Disposition: A | Discharge status: Home / Self Care | Payer: Commercial Managed Care - HMO | Source: Ambulatory Visit | Attending: Internal Medicine | Admitting: Internal Medicine

## 2014-09-21 DIAGNOSIS — M899 Disorder of bone, unspecified: Secondary | ICD-10-CM | POA: Insufficient documentation

## 2014-09-21 DIAGNOSIS — Z780 Asymptomatic menopausal state: Secondary | ICD-10-CM | POA: Diagnosis not present

## 2014-09-21 DIAGNOSIS — M8580 Other specified disorders of bone density and structure, unspecified site: Secondary | ICD-10-CM

## 2014-09-21 DIAGNOSIS — M85851 Other specified disorders of bone density and structure, right thigh: Secondary | ICD-10-CM | POA: Diagnosis not present

## 2014-09-22 ENCOUNTER — Other Ambulatory Visit (HOSPITAL_COMMUNITY): Payer: Self-pay | Admitting: Internal Medicine

## 2014-09-22 DIAGNOSIS — I6523 Occlusion and stenosis of bilateral carotid arteries: Secondary | ICD-10-CM

## 2014-09-23 DIAGNOSIS — M5106 Intervertebral disc disorders with myelopathy, lumbar region: Secondary | ICD-10-CM | POA: Diagnosis not present

## 2014-09-23 DIAGNOSIS — M5432 Sciatica, left side: Secondary | ICD-10-CM | POA: Diagnosis not present

## 2014-09-23 DIAGNOSIS — M5136 Other intervertebral disc degeneration, lumbar region: Secondary | ICD-10-CM | POA: Diagnosis not present

## 2014-09-29 ENCOUNTER — Ambulatory Visit (HOSPITAL_COMMUNITY)
Admission: RE | Admit: 2014-09-29 | Discharge: 2014-09-29 | Disposition: A | Discharge status: Home / Self Care | Payer: Commercial Managed Care - HMO | Source: Ambulatory Visit | Attending: Internal Medicine | Admitting: Internal Medicine

## 2014-09-29 DIAGNOSIS — I6523 Occlusion and stenosis of bilateral carotid arteries: Secondary | ICD-10-CM | POA: Diagnosis not present

## 2014-10-10 DIAGNOSIS — J309 Allergic rhinitis, unspecified: Secondary | ICD-10-CM | POA: Diagnosis not present

## 2014-10-19 ENCOUNTER — Ambulatory Visit (HOSPITAL_COMMUNITY): Payer: Commercial Managed Care - HMO | Attending: Internal Medicine | Admitting: Physical Therapy

## 2014-10-19 DIAGNOSIS — R29898 Other symptoms and signs involving the musculoskeletal system: Secondary | ICD-10-CM | POA: Diagnosis not present

## 2014-10-19 DIAGNOSIS — R198 Other specified symptoms and signs involving the digestive system and abdomen: Secondary | ICD-10-CM | POA: Insufficient documentation

## 2014-10-19 DIAGNOSIS — M545 Low back pain, unspecified: Secondary | ICD-10-CM

## 2014-10-19 DIAGNOSIS — M6289 Other specified disorders of muscle: Secondary | ICD-10-CM | POA: Diagnosis not present

## 2014-10-19 DIAGNOSIS — M6281 Muscle weakness (generalized): Secondary | ICD-10-CM

## 2014-10-19 NOTE — Therapy (Signed)
[facility] [place], [place], [address] Phone: [phone]   Fax:  [phone]  Physical Therapy Evaluation  Patient Details  Name: Taylor [name] MRN: [id number] Date of Birth: [birth year] Referring Provider:  Delphina Cahill, MD  Encounter Date: 10/19/2014      PT End of Session - 10/19/14 0854    Visit Number 1   Number of Visits 6   Date for PT Re-Evaluation 11/16/14   Authorization Type Medicare/[employer] HMO    Authorization Time Period 10/19/14 to 12/20/14   Authorization - Visit Number 1   Authorization - Number of Visits 10   PT Start Time 0804   PT Stop Time 0845   PT Time Calculation (min) 41 min   Activity Tolerance Patient tolerated treatment well   Behavior During Therapy [facility] for tasks assessed/performed      Past Medical History  Diagnosis Date  . Allergy     seasonal allergies  . Wears glasses   . GERD (gastroesophageal reflux disease)   . Wears dentures   . COPD (chronic obstructive pulmonary disease)   . Arthritis   . Carpal tunnel syndrome   . Carotid artery occlusion     Past Surgical History  Procedure Laterality Date  . Knee surgery  2012    left, cartilage repair  . Abdominal hysterectomy  1974    still has ovaries  . Knee surgery  2005    left knee, cartilage repair  . Knee surgery  2012    left, cartilage repair  . Foot neuroma surgery  2008    right  . Colonoscopy  2006    [place], repeat 2012  . Hernia repair  2000    right inguinal  . Carpal tunnel release  08/15/2011    Procedure: CARPAL TUNNEL RELEASE;  Surgeon: Cammie Sickle., MD;  Location: [facility];  Service: Orthopedics;  Laterality: Right;  . Excision morton's neuroma  11/14/2011    Procedure: EXCISION MORTON'S NEUROMA;  Surgeon: Marcheta Grammes, DPM;  Location: [facility];  Service: Orthopedics;  Laterality: Right;  Excision of Neuroma 2nd Interspace Right Foot  . Cyst excision       vaginal X3  . Cataract extraction w/phaco Right 10/04/2013    Procedure: CATARACT EXTRACTION PHACO AND INTRAOCULAR LENS PLACEMENT (IOC);  Surgeon: Tonny Branch, MD;  Location: [facility];  Service: Ophthalmology;  Laterality: Right;  CDE 12.10  . Cataract extraction w/phaco Left 10/28/2013    Procedure: CATARACT EXTRACTION PHACO AND INTRAOCULAR LENS PLACEMENT (IOC);  Surgeon: Tonny Branch, MD;  Location: [facility];  Service: Ophthalmology;  Laterality: Left;  CDE: 9.10    There were no vitals filed for this visit.  Visit Diagnosis:  Midline low back pain without sciatica - Plan: PT plan of care cert/re-cert  Weakness of both lower extremities - Plan: PT plan of care cert/re-cert  Proximal muscle weakness - Plan: PT plan of care cert/re-cert  Abdominal weakness - Plan: PT plan of care cert/re-cert      Subjective Assessment - 10/19/14 0806    Subjective Patient is currently not having any problems, just a little bit of pain from where drainage tube was. Was in a back brace but does not need it right now. Can lift up to 25 pounds now. Can go back to mowing yard in august with brace. Still having some numbness on L thigh, also some altered sensation.    Pertinent History Pain started  in November; she went to a chiropractor without getting it checked out, improved for a little bit but then got REALLY bad. Had numbness in groin and was referred to MD immediately. X-ray did not reveal anything major,, but MRI revealed collapsed disc and MD recommended immediate surgery on L5/S1 which was performed on 30th of March.    How long can you sit comfortably? no limits    How long can you stand comfortably? no real limits but doesn't really sit still much    How long can you walk comfortably? no limits   Patient Stated Goals get strength back    Currently in Pain? No/denies            [facility] PT Assessment - 10/19/14 0001    Assessment   Medical Diagnosis back pain    Onset Date/Surgical Date 06/22/14   Next  MD Visit no follow up visit unless needed    Precautions   Precautions Other (comment)   Precaution Comments cannot lift more than 25# right now    Restrictions   Weight Bearing Restrictions No   Balance Screen   Has the patient fallen in the past 6 months No   Has the patient had a decrease in activity level because of a fear of falling?  Yes   Is the patient reluctant to leave their home because of a fear of falling?  No   Prior Function   Level of Independence Independent;Independent with basic ADLs;Independent with gait;Independent with transfers   Vocation Retired   Leisure no real hobbies right now, but loves to do yardwork    Observation/Other Assessments   Focus on Therapeutic Outcomes (FOTO)  26% limited    Posture/Postural Control   Posture Comments flat spinal curves, forward head, B IR shoulders   AROM   Right Hip External Rotation  --  full range    Right Hip Internal Rotation  45   Left Hip External Rotation  --  full range    Left Hip Internal Rotation  43   Lumbar Flexion 90   Lumbar Extension 20   Lumbar - Right Side Bend 25; fingertips to knee    Lumbar - Left Side Bend 16; fingertips to approx 1 inch above knee    Strength   Overall Strength Comments upper and lower abs approx 4/5    Right Hip Flexion 4/5   Right Hip Extension 4-/5   Right Hip ABduction 4/5   Left Hip Flexion 4-/5   Left Hip Extension 4-/5   Left Hip ABduction 3+/5   Right Knee Flexion 4/5   Right Knee Extension 4+/5   Left Knee Flexion 3+/5   Left Knee Extension 4+/5   Right Ankle Dorsiflexion 5/5   Left Ankle Dorsiflexion 4+/5   Ambulation/Gait   Gait Comments reduced TKE B, reduced heel-toe gait, proximal muscle weakness    6 minute walk test results    Endurance additional comments 6 minute walk 1272ft, gait speed 1.62m/s                            PT Education - 10/19/14 0853    Education provided Yes   Education Details prognosis, plan of care, HEP     Person(s) Educated Patient   Methods Explanation;Verbal cues;Handout   Comprehension Verbalized understanding;Returned demonstration          PT Short Term Goals - 10/19/14 0932    PT SHORT TERM GOAL #  1   Title Patient will be independent in correctly and consistently performing appropriate HEP, to be updated every session    Time 3   Period Weeks   Status New   PT SHORT TERM GOAL #2   Title Patient to demonstrate bilateral lower extremity strength of at least 4/5 and proximal muscle strength of at least 4+/5    Time 3   Period Weeks   Status New           PT Long Term Goals - 2014/10/21 0934    PT LONG TERM GOAL #1   Title Patient will demosntrate 5/5 strength in bilateral lower extremities, proximal muscles and core strength 5/5    Time 6   Period Weeks   Status New   PT LONG TERM GOAL #2   Title Patient to demonstrate proper lifting mechanics with up to 25 pounds, no cues for safety or form on a consistent basis    Time 6   Period Weeks   Status New   PT LONG TERM GOAL #3   Title Patient will be independent in advanced HEP for home management of condition    Time 6   Period Weeks   Status New   PT LONG TERM GOAL #4   Title Patient to report that she is consistently experiencing 0/10 pain during all functional weight bearing tasks and activities of unlimited duration    Time 6   Period [place] - 2014/10/21 0902    Clinical Impression Statement Patient presents to skilled PT services with occasional back pain, muscle weakness in bilateral lower extremities and proximal/core musculature, mild lumbar stiffness. Patient reports that she has recovered well from her surgery and  really is not limited in what she needs to do besides not being able to go back to yardwork yet. At this time, patient is very motivated to participate in skilled PT services to address her remainnig deficits and to assist her in developing appropriate HEP for  home management.    Pt will benefit from skilled therapeutic intervention in order to improve on the following deficits Increased muscle spasms;Pain;Hypomobility;Improper body mechanics;Decreased mobility;Decreased strength   Rehab Potential Excellent   PT Frequency 1x / week   PT Duration 6 weeks   PT Treatment/Interventions ADLs/Self Care Home Management;Gait training;Stair training;Functional mobility training;Therapeutic activities;Therapeutic exercise;Balance training;Neuromuscular re-education;Patient/family education   PT Next Visit Plan review HEP and goals; functional stretching and strength as appropriate, progress HEP    PT Home Exercise Plan given    Consulted and Agree with Plan of Care Patient          G-Codes - 21-Oct-2014 0937    Functional Assessment Tool Used FOTO 26% limited    Functional Limitation Mobility: Walking and moving around   Mobility: Walking and Moving Around Current Status [phone]) At least 20 percent but less than 40 percent impaired, limited or restricted   Mobility: Walking and Moving Around Goal Status [phone]) At least 1 percent but less than 20 percent impaired, limited or restricted       Problem List Patient Active Problem List   Diagnosis Date Noted  . Occlusion and stenosis of carotid artery without mention of cerebral infarction 11/17/2012  . Pain in joint, ankle and foot 02/08/2011  . Difficulty in walking(719.7) 02/08/2011  . Avulsion fracture of ankle 02/08/2011  . Stiffness of joint, not elsewhere classified, ankle  and foot 02/08/2011  . Urinary tract infection, site not specified 09/10/2010    Deniece Ree PT, DPT [facility] [address] [place], [place], [address] Phone: [phone]   Fax:  [phone]

## 2014-10-19 NOTE — Patient Instructions (Signed)
   BRIDGING  While lying on your back, tighten your lower abdominals, squeeze your buttocks and then raise your buttocks off the floor/bed as creating a "Bridge" with your body. Repeat 15 times, twice a day.     HIP ABDUCTION - SIDELYING  While lying on your side, slowly raise up your top leg to the side. Keep your knee straight and maintain your toes pointed forward the entire time.   The bottom leg can be bent to stabilize your body. Repeat 10 times per leg, twice a day.    PRONE HIP EXTENSION  While lying face down with your knee straight, slowly raise up leg off the ground. Make sure that you are just using your hip muscles and not your back muscles- the motion should feel small, not big.   Repeat 10 times each leg, twice a day.    SEATED MARCHES WITH OPPOSITE ARM/LEG  Sit on the side of your bed or a chair and raise your opposite arm and leg- for example, raise your right arm and left leg at the same time. Make sure you are squeezing your core tight during this exercise. Repeat 10 times, twice a day.

## 2014-10-26 ENCOUNTER — Ambulatory Visit (HOSPITAL_COMMUNITY): Payer: Commercial Managed Care - HMO | Attending: Internal Medicine | Admitting: Physical Therapy

## 2014-10-26 DIAGNOSIS — M545 Low back pain, unspecified: Secondary | ICD-10-CM

## 2014-10-26 DIAGNOSIS — R198 Other specified symptoms and signs involving the digestive system and abdomen: Secondary | ICD-10-CM

## 2014-10-26 DIAGNOSIS — M6289 Other specified disorders of muscle: Secondary | ICD-10-CM | POA: Diagnosis not present

## 2014-10-26 DIAGNOSIS — R29898 Other symptoms and signs involving the musculoskeletal system: Secondary | ICD-10-CM | POA: Diagnosis not present

## 2014-10-26 DIAGNOSIS — M6281 Muscle weakness (generalized): Secondary | ICD-10-CM

## 2014-10-26 NOTE — Therapy (Signed)
[place], [place], [address] Phone: [phone]   Fax:  [phone]  Physical Therapy Treatment  Patient Details  Name: Taylor [name] MRN: [id number] Date of Birth: 05/12/1950 Referring Provider:  Delphina Cahill, MD  Encounter Date: 10/26/2014      PT End of Session - 10/26/14 1203    Visit Number 2   Number of Visits 6   Date for PT Re-Evaluation 11/16/14   Authorization Type Medicare/[employer] H[employer]    Authorization Time Period 10/19/14 to 12/20/14   Authorization - Visit Number 2   Authorization - Number of Visits 10   PT Start Time 0931   PT Stop Time 1014   PT Time Calculation (min) 43 min   Activity Tolerance Patient tolerated treatment well   Behavior During Therapy [facility] for tasks assessed/performed      Past Medical History  Diagnosis Date  . Allergy     seasonal allergies  . Wears glasses   . GERD (gastroesophageal reflux disease)   . Wears dentures   . COPD (chronic obstructive pulmonary disease)   . Arthritis   . Carpal tunnel syndrome   . Carotid artery occlusion     Past Surgical History  Procedure Laterality Date  . Knee surgery  2012    left, cartilage repair  . Abdominal hysterectomy  1974    still has ovaries  . Knee surgery  2005    left knee, cartilage repair  . Knee surgery  2012    left, cartilage repair  . Foot neuroma surgery  2008    right  . Colonoscopy  2006    [place], repeat 2012  . Hernia repair  2000    right inguinal  . Carpal tunnel release  08/15/2011    Procedure: CARPAL TUNNEL RELEASE;  Surgeon: Cammie Sickle., MD;  Location: [facility];  Service: Orthopedics;  Laterality: Right;  . Excision morton's neuroma  11/14/2011    Procedure: EXCISION MORTON'S NEUROMA;  Surgeon: Marcheta Grammes, DPM;  [facility]: [facility];  Service: Orthopedics;  Laterality: Right;  Excision of Neuroma 2nd Interspace Right Foot  . Cyst excision     vaginal X3  . Cataract extraction w/phaco Right 10/04/2013    Procedure: CATARACT EXTRACTION PHACO AND INTRAOCULAR LENS PLACEMENT (IOC);  Surgeon: Tonny Branch, MD;  Location: [facility];  Service: Ophthalmology;  Laterality: Right;  CDE 12.10  . Cataract extraction w/phaco Left 10/28/2013    Procedure: CATARACT EXTRACTION PHACO AND INTRAOCULAR LENS PLACEMENT (IOC);  Surgeon: Tonny Branch, MD;  Location: [facility];  Service: Ophthalmology;  Laterality: Left;  CDE: 9.10    There were no vitals filed for this visit.  Visit Diagnosis:  Midline low back pain without sciatica  Weakness of both lower extremities  Proximal muscle weakness  Abdominal weakness      Subjective Assessment - 10/26/14 0935    Subjective Pt reports that she tried to mow her yard yesterday on her riding lawn mower, and was on it for about an hour. She woke up very sore in her back this morning.    Currently in Pain? Yes   Pain Score 4    Pain Location Back   Pain Orientation Right;Lower   Pain Descriptors / Indicators Sore               [facility] Adult PT Treatment/Exercise - 10/26/14 0001    Exercises   Exercises Lumbar   Lumbar  Exercises: Stretches   Active Hamstring Stretch 3 reps;30 seconds   Active Hamstring Stretch Limitations 14" step   Piriformis Stretch 3 reps;30 seconds   Piriformis Stretch Limitations seated   Lumbar Exercises: Supine   Ab Set 10 reps;3 seconds   AB Set Limitations TrA training   Heel Slides 10 reps   Heel Slides Limitations TrA contraction   Bent Knee Raise 10 reps   Bent Knee Raise Limitations TrA contraction   Bridge 10 reps   Lumbar Exercises: Sidelying   Hip Abduction 10 reps   Lumbar Exercises: Prone   Single Arm Raise 10 reps   Straight Leg Raise 10 reps   Opposite Arm/Leg Raise Right arm/Left leg;Left arm/Right leg;10 reps   Manual Therapy   Manual Therapy Soft tissue mobilization   Soft tissue mobilization STM to R lumbar paraspinals, QL                PT  Education - 10/26/14 1202    Education provided Yes   Education Details Updated HEP   Person(s) Educated Patient   Methods Explanation;Handout   Comprehension Verbalized understanding;Returned demonstration          PT Short Term Goals - 10/19/14 0932    PT SHORT TERM GOAL #1   Title Patient will be independent in correctly and consistently performing appropriate HEP, to be updated every session    Time 3   Period Weeks   Status New   PT SHORT TERM GOAL #2   Title Patient to demonstrate bilateral lower extremity strength of at least 4/5 and proximal muscle strength of at least 4+/5    Time 3   Period Weeks   Status New           PT Long Term Goals - 10/19/14 0934    PT LONG TERM GOAL #1   Title Patient will demosntrate 5/5 strength in bilateral lower extremities, proximal muscles and core strength 5/5    Time 6   Period Weeks   Status New   PT LONG TERM GOAL #2   Title Patient to demonstrate proper lifting mechanics with up to 25 pounds, no cues for safety or form on a consistent basis    Time 6   Period Weeks   Status New   PT LONG TERM GOAL #3   Title Patient will be independent in advanced HEP for home management of condition    Time 6   Period Weeks   Status New   PT LONG TERM GOAL #4   Title Patient to report that she is consistently experiencing 0/10 pain during all functional weight bearing tasks and activities of unlimited duration    Time 6   Period Weeks   Status New               Plan - 10/26/14 1204    Clinical Impression Statement Pt presented with muscle tightness and trigger points in her R lumbar paraspinals that resolved with manual therapy. Pt reported decreased pain and iimproved ability to move following manual therapy to lumbar musculature. Treatment focused on core strengthening and TrA activation. Pt responded well to all therex, needing minimal verbal and tactile cueing to complete TrA contraction.    PT Next Visit Plan Progress to  quadruped exercises, sidestepping with tband        Problem List Patient Active Problem List   Diagnosis Date Noted  . Occlusion and stenosis of carotid artery without mention of cerebral infarction 11/17/2012  .  Pain in joint, ankle and foot 02/08/2011  . Difficulty in walking(719.7) 02/08/2011  . Avulsion fracture of ankle 02/08/2011  . Stiffness of joint, not elsewhere classified, ankle and foot 02/08/2011  . Urinary tract infection, site not specified 09/10/2010    Hilma Favors, PT, DPT [phone] 10/26/2014, 12:09 PM  [facility] [address] [place], [place], [address] Phone: [phone]   Fax:  [phone]

## 2014-11-01 ENCOUNTER — Encounter (HOSPITAL_COMMUNITY): Payer: Commercial Managed Care - HMO | Admitting: Physical Therapy

## 2014-11-03 ENCOUNTER — Ambulatory Visit (HOSPITAL_COMMUNITY): Payer: Commercial Managed Care - HMO | Admitting: Physical Therapy

## 2014-11-03 DIAGNOSIS — R198 Other specified symptoms and signs involving the digestive system and abdomen: Secondary | ICD-10-CM

## 2014-11-03 DIAGNOSIS — M6289 Other specified disorders of muscle: Secondary | ICD-10-CM | POA: Diagnosis not present

## 2014-11-03 DIAGNOSIS — M6281 Muscle weakness (generalized): Secondary | ICD-10-CM

## 2014-11-03 DIAGNOSIS — M545 Low back pain, unspecified: Secondary | ICD-10-CM

## 2014-11-03 DIAGNOSIS — R29898 Other symptoms and signs involving the musculoskeletal system: Secondary | ICD-10-CM | POA: Diagnosis not present

## 2014-11-03 NOTE — Therapy (Signed)
[place], [place], [address] Phone: [phone]   Fax:  [phone]  Physical Therapy Treatment  Patient Details  Name: Taylor [name] MRN: [id number] Date of Birth: March 20, 1951 Referring Provider:  Delphina Cahill, MD  Encounter Date: 11/03/2014      PT End of Session - 11/03/14 1033    Visit Number 3   Number of Visits 6   Date for PT Re-Evaluation 11/16/14   Authorization Type Medicare/[employer] H[employer]    Authorization Time Period 10/19/14 to 12/20/14   Authorization - Visit Number 3   Authorization - Number of Visits 10   PT Start Time 0850   PT Stop Time 0934   PT Time Calculation (min) 44 min   Activity Tolerance Patient tolerated treatment well   Behavior During Therapy [facility] for tasks assessed/performed      Past Medical History  Diagnosis Date  . Allergy     seasonal allergies  . Wears glasses   . GERD (gastroesophageal reflux disease)   . Wears dentures   . COPD (chronic obstructive pulmonary disease)   . Arthritis   . Carpal tunnel syndrome   . Carotid artery occlusion     Past Surgical History  Procedure Laterality Date  . Knee surgery  2012    left, cartilage repair  . Abdominal hysterectomy  1974    still has ovaries  . Knee surgery  2005    left knee, cartilage repair  . Knee surgery  2012    left, cartilage repair  . Foot neuroma surgery  2008    right  . Colonoscopy  2006    [place], repeat 2012  . Hernia repair  2000    right inguinal  . Carpal tunnel release  08/15/2011    Procedure: CARPAL TUNNEL RELEASE;  Surgeon: Cammie Sickle., MD;  Location: [facility];  Service: Orthopedics;  Laterality: Right;  . Excision morton's neuroma  11/14/2011    Procedure: EXCISION MORTON'S NEUROMA;  Surgeon: Marcheta Grammes, DPM;  [facility];  Service: Orthopedics;  Laterality: Right;  Excision of Neuroma 2nd Interspace Right Foot  . Cyst excision       vaginal X3  . Cataract extraction w/phaco Right 10/04/2013    Procedure: CATARACT EXTRACTION PHACO AND INTRAOCULAR LENS PLACEMENT (IOC);  Surgeon: Tonny Branch, MD;  [facility];  Service: Ophthalmology;  Laterality: Right;  CDE 12.10  . Cataract extraction w/phaco Left 10/28/2013    Procedure: CATARACT EXTRACTION PHACO AND INTRAOCULAR LENS PLACEMENT (IOC);  Surgeon: Tonny Branch, MD;  [facility];  Service: Ophthalmology;  Laterality: Left;  CDE: 9.10    There were no vitals filed for this visit.  Visit Diagnosis:  Midline low back pain without sciatica  Weakness of both lower extremities  Proximal muscle weakness  Abdominal weakness      Subjective Assessment - 11/03/14 1002    Subjective Pt states [place].  Statess she did really well and only had difficulty coming up from [place].  States she had a rest a few times but had no pain.  Pt currently without pain.   Currently in Pain? No/denies                         [facility] Adult PT Treatment/Exercise - 11/03/14 0858    Lumbar Exercises: Stretches   Active Hamstring Stretch  3 reps;30 seconds   Active Hamstring Stretch Limitations 14" step   ITB Stretch 3 reps;30 seconds   ITB Stretch Limitations gastroc stretch slant board   Lumbar Exercises: Aerobic   Stationary Bike nustep 8 minutes level 3 hills #3   Lumbar Exercises: Standing   Functional Squats 10 reps   Forward Lunge 10 reps   Forward Lunge Limitations bilatearlly on 4" box   Scapular Retraction 10 reps;Theraband   Theraband Level (Scapular Retraction) Level 3 (Green)   Row 10 reps;Theraband   Theraband Level (Row) Level 3 (Green)   Shoulder Extension 10 reps;Theraband   Theraband Level (Shoulder Extension) Level 3 (Green)   Other Standing Lumbar Exercises side step wtih red tband 2RT   Lumbar Exercises: Quadruped   Single Arm Raise 10 reps   Straight Leg Raise 10 reps                   PT Short Term Goals - 10/19/14 0932    PT SHORT TERM GOAL #1   Title Patient will be independent in correctly and consistently performing appropriate HEP, to be updated every session    Time 3   Period Weeks   Status New   PT SHORT TERM GOAL #2   Title Patient to demonstrate bilateral lower extremity strength of at least 4/5 and proximal muscle strength of at least 4+/5    Time 3   Period Weeks   Status New           PT Long Term Goals - 10/19/14 0934    PT LONG TERM GOAL #1   Title Patient will demosntrate 5/5 strength in bilateral lower extremities, proximal muscles and core strength 5/5    Time 6   Period Weeks   Status New   PT LONG TERM GOAL #2   Title Patient to demonstrate proper lifting mechanics with up to 25 pounds, no cues for safety or form on a consistent basis    Time 6   Period Weeks   Status New   PT LONG TERM GOAL #3   Title Patient will be independent in advanced HEP for home management of condition    Time 6   Period Weeks   Status New   PT LONG TERM GOAL #4   Title Patient to report that she is consistently experiencing 0/10 pain during all functional weight bearing tasks and activities of unlimited duration    Time 6   Period Weeks   Status New               Plan - 11/03/14 1034    Clinical Impression Statement Pt without pain today.  Progressed functional strengthening exercises with good form and control.  Added postural 3 exericise with theraband and quadruped core stability.  Completed with nustep to increase actvitiy tolerance.   PT Next Visit Plan Continue to increase functional strengthening with manual PRN        Problem List Patient Active Problem List   Diagnosis Date Noted  . Occlusion and stenosis of carotid artery without mention of cerebral infarction 11/17/2012  . Pain in joint, ankle and foot 02/08/2011  . Difficulty in walking(719.7) 02/08/2011  . Avulsion fracture of ankle 02/08/2011  . Stiffness of joint, not  elsewhere classified, ankle and foot 02/08/2011  . Urinary tract infection, site not specified 09/10/2010    Teena Irani, PTA/CLT [phone]  11/03/2014, 10:38 AM  [place] [facility] [address]  [place], [place], [address] Phone: [phone]   Fax:  [phone]

## 2014-11-08 ENCOUNTER — Ambulatory Visit (HOSPITAL_COMMUNITY): Payer: Commercial Managed Care - HMO | Admitting: Physical Therapy

## 2014-11-08 DIAGNOSIS — M545 Low back pain, unspecified: Secondary | ICD-10-CM

## 2014-11-08 DIAGNOSIS — R198 Other specified symptoms and signs involving the digestive system and abdomen: Secondary | ICD-10-CM

## 2014-11-08 DIAGNOSIS — M6289 Other specified disorders of muscle: Secondary | ICD-10-CM | POA: Diagnosis not present

## 2014-11-08 DIAGNOSIS — R29898 Other symptoms and signs involving the musculoskeletal system: Secondary | ICD-10-CM | POA: Diagnosis not present

## 2014-11-08 DIAGNOSIS — M6281 Muscle weakness (generalized): Secondary | ICD-10-CM

## 2014-11-08 NOTE — Therapy (Signed)
[place], [place], [address] Phone: [phone]   Fax:  [phone]  Physical Therapy Treatment  Patient Details  Name: Taylor [name] MRN: [id number] Date of Birth: November 03, 1950 Referring Provider:  Delphina Cahill, MD  Encounter Date: 11/08/2014      PT End of Session - 11/08/14 0927    Visit Number 4   Number of Visits 6   Date for PT Re-Evaluation 11/16/14   Authorization Type Medicare/[employer] HMO    Authorization Time Period 10/19/14 to 12/20/14   Authorization - Visit Number 4   Authorization - Number of Visits 10   PT Start Time 0850   PT Stop Time 0930   PT Time Calculation (min) 40 min   Activity Tolerance Patient tolerated treatment well   Behavior During Therapy [facility] for tasks assessed/performed      Past Medical History  Diagnosis Date  . Allergy     seasonal allergies  . Wears glasses   . GERD (gastroesophageal reflux disease)   . Wears dentures   . COPD (chronic obstructive pulmonary disease)   . Arthritis   . Carpal tunnel syndrome   . Carotid artery occlusion     Past Surgical History  Procedure Laterality Date  . Knee surgery  2012    left, cartilage repair  . Abdominal hysterectomy  1974    still has ovaries  . Knee surgery  2005    left knee, cartilage repair  . Knee surgery  2012    left, cartilage repair  . Foot neuroma surgery  2008    right  . Colonoscopy  2006    [place], repeat 2012  . Hernia repair  2000    right inguinal  . Carpal tunnel release  08/15/2011    Procedure: CARPAL TUNNEL RELEASE;  Surgeon: Cammie Sickle., MD;  Location: [place] [facility];  Service: Orthopedics;  Laterality: Right;  . Excision morton's neuroma  11/14/2011    Procedure: EXCISION MORTON'S NEUROMA;  Surgeon: Marcheta Grammes, DPM;  [facility]: [facility];  Service: Orthopedics;  Laterality: Right;  Excision of Neuroma 2nd Interspace Right Foot  . Cyst excision     vaginal X3  . Cataract extraction w/phaco Right 10/04/2013    Procedure: CATARACT EXTRACTION PHACO AND INTRAOCULAR LENS PLACEMENT (IOC);  Surgeon: Tonny Branch, MD;  Location: [facility];  Service: Ophthalmology;  Laterality: Right;  CDE 12.10  . Cataract extraction w/phaco Left 10/28/2013    Procedure: CATARACT EXTRACTION PHACO AND INTRAOCULAR LENS PLACEMENT (IOC);  Surgeon: Tonny Branch, MD;  [facility]: [facility];  Service: Ophthalmology;  Laterality: Left;  CDE: 9.10    There were no vitals filed for this visit.  Visit Diagnosis:  Midline low back pain without sciatica  Weakness of both lower extremities  Proximal muscle weakness  Abdominal weakness      Subjective Assessment - 11/08/14 0931    Subjective PT states she is doing well today.  States she worked on a birdhouse project too long and had some mid back discomfort but other than that, no real pain or issues today.   Currently in Pain? No/denies                         [facility] Adult PT Treatment/Exercise - 11/08/14 0001    Lumbar Exercises: Stretches   Active Hamstring Stretch 3 reps;30 seconds   Active Hamstring Stretch Limitations 14" step   ITB  Stretch 3 reps;30 seconds   ITB Stretch Limitations gastroc stretch slant board   Piriformis Stretch 3 reps;30 seconds   Piriformis Stretch Limitations seated   Lumbar Exercises: Aerobic   Stationary Bike nustep 8 minutes level 3 hills #3   Lumbar Exercises: Standing   Functional Squats 10 reps   Forward Lunge 10 reps   Forward Lunge Limitations bilaterally on 2" box   Scapular Retraction 10 reps;Theraband   Theraband Level (Scapular Retraction) Level 3 (Green)   Row 10 reps;Theraband   Theraband Level (Row) Level 3 (Green)   Shoulder Extension 10 reps;Theraband   Theraband Level (Shoulder Extension) Level 3 (Green)   Other Standing Lumbar Exercises side step wtih green tband 2RT   Lumbar Exercises: Quadruped   Single Arm Raise 10 reps   Straight Leg Raise 10 reps    Opposite Arm/Leg Raise 5 reps                  PT Short Term Goals - 10/19/14 0932    PT SHORT TERM GOAL #1   Title Patient will be independent in correctly and consistently performing appropriate HEP, to be updated every session    Time 3   Period Weeks   Status New   PT SHORT TERM GOAL #2   Title Patient to demonstrate bilateral lower extremity strength of at least 4/5 and proximal muscle strength of at least 4+/5    Time 3   Period Weeks   Status New           PT Long Term Goals - 10/19/14 0934    PT LONG TERM GOAL #1   Title Patient will demosntrate 5/5 strength in bilateral lower extremities, proximal muscles and core strength 5/5    Time 6   Period Weeks   Status New   PT LONG TERM GOAL #2   Title Patient to demonstrate proper lifting mechanics with up to 25 pounds, no cues for safety or form on a consistent basis    Time 6   Period Weeks   Status New   PT LONG TERM GOAL #3   Title Patient will be independent in advanced HEP for home management of condition    Time 6   Period Weeks   Status New   PT LONG TERM GOAL #4   Title Patient to report that she is consistently experiencing 0/10 pain during all functional weight bearing tasks and activities of unlimited duration    Time [employer] - 11/08/14 4481    Clinical Impression Statement Pt continues to do well without pain.  Progressed to 2" step with lunges and green theraband with side stepping activity.  Also added opposite UE/LE task in quadruped with noted difficulty due to core weakness.  Pt continued to be painfree at end of session.   PT Next Visit Plan Continue to increase functional strengthening with manual PRN        Problem List Patient Active Problem List   Diagnosis Date Noted  . Occlusion and stenosis of carotid artery without mention of cerebral infarction 11/17/2012  . Pain in joint, ankle and foot 02/08/2011  . Difficulty in  walking(719.7) 02/08/2011  . Avulsion fracture of ankle 02/08/2011  . Stiffness of joint, not elsewhere classified, ankle and foot 02/08/2011  . Urinary tract infection, site not specified 09/10/2010   Teena Irani,  PTA/CLT [phone]  11/08/2014, 9:32 AM  [place] [address] [place], [place], [address] Phone: [phone]   Fax:  [phone]

## 2014-11-10 ENCOUNTER — Ambulatory Visit (HOSPITAL_COMMUNITY): Payer: Commercial Managed Care - HMO | Admitting: Physical Therapy

## 2014-11-11 DIAGNOSIS — Z111 Encounter for screening for respiratory tuberculosis: Secondary | ICD-10-CM | POA: Diagnosis not present

## 2014-11-15 ENCOUNTER — Ambulatory Visit (HOSPITAL_COMMUNITY): Payer: Commercial Managed Care - HMO | Admitting: Physical Therapy

## 2014-11-15 DIAGNOSIS — M6289 Other specified disorders of muscle: Secondary | ICD-10-CM | POA: Diagnosis not present

## 2014-11-15 DIAGNOSIS — R29898 Other symptoms and signs involving the musculoskeletal system: Secondary | ICD-10-CM

## 2014-11-15 DIAGNOSIS — M6281 Muscle weakness (generalized): Secondary | ICD-10-CM

## 2014-11-15 DIAGNOSIS — M545 Low back pain, unspecified: Secondary | ICD-10-CM

## 2014-11-15 DIAGNOSIS — R198 Other specified symptoms and signs involving the digestive system and abdomen: Secondary | ICD-10-CM | POA: Diagnosis not present

## 2014-11-15 NOTE — Therapy (Signed)
[employer] [address] [place], [place], [address] Phone: [phone]   Fax:  [phone]  Physical Therapy Treatment (Discharge Assessment)  Patient Details  Name: Taylor [name] MRN: [id number] Date of Birth: [birth year] Referring Provider:  Delphina Cahill, MD  Encounter Date: 11/15/2014      PT End of Session - 11/15/14 1042    Visit Number 5   Number of Visits 6   Authorization Type Medicare/[employer] H[employer]    Authorization Time Period 10/19/14 to 12/20/14   Authorization - Visit Number 5   Authorization - Number of Visits 10   PT Start Time 0846   PT Stop Time 0926   PT Time Calculation (min) 40 min   Activity Tolerance Patient tolerated treatment well   Behavior During Therapy [facility] for tasks assessed/performed      Past Medical History  Diagnosis Date  . Allergy     seasonal allergies  . Wears glasses   . GERD (gastroesophageal reflux disease)   . Wears dentures   . COPD (chronic obstructive pulmonary disease)   . Arthritis   . Carpal tunnel syndrome   . Carotid artery occlusion     Past Surgical History  Procedure Laterality Date  . Knee surgery  2012    left, cartilage repair  . Abdominal hysterectomy  1974    still has ovaries  . Knee surgery  2005    left knee, cartilage repair  . Knee surgery  2012    left, cartilage repair  . Foot neuroma surgery  2008    right  . Colonoscopy  2006    [place], repeat 2012  . Hernia repair  2000    right inguinal  . Carpal tunnel release  08/15/2011    Procedure: CARPAL TUNNEL RELEASE;  Surgeon: Cammie Sickle., MD;  Location: [place];  Service: Orthopedics;  Laterality: Right;  . Excision morton's neuroma  11/14/2011    Procedure: EXCISION MORTON'S NEUROMA;  Surgeon: Marcheta Grammes, DPM;  Location: [facility];  Service: Orthopedics;  Laterality: Right;  Excision of Neuroma 2nd Interspace Right Foot  . Cyst excision      vaginal X3  .  Cataract extraction w/phaco Right 10/04/2013    Procedure: CATARACT EXTRACTION PHACO AND INTRAOCULAR LENS PLACEMENT (IOC);  Surgeon: Tonny Branch, MD;  Location: [facility];  Service: Ophthalmology;  Laterality: Right;  CDE 12.10  . Cataract extraction w/phaco Left 10/28/2013    Procedure: CATARACT EXTRACTION PHACO AND INTRAOCULAR LENS PLACEMENT (IOC);  Surgeon: Tonny Branch, MD;  Location: [facility];  Service: Ophthalmology;  Laterality: Left;  CDE: 9.10    There were no vitals filed for this visit.  Visit Diagnosis:  Midline low back pain without sciatica  Weakness of both lower extremities  Proximal muscle weakness  Abdominal weakness      Subjective Assessment - 11/15/14 0847    Subjective Patient reports that she is diong very well today; reports that she is no longer having pain with tasks around home that previous did give her some discomfort. Cleaned out huge freezer chest the other day with no pain.    Pertinent History Pain started in November; she went to a chiropractor without getting it checked out, improved for a little bit but then got REALLY bad. Had numbness in groin and was referred to MD immediately. X-ray did not reveal anything major,, but MRI revealed collapsed disc and MD recommended immediate surgery on L5/S1 which was  performed on 30th of March.    How long can you sit comfortably? no limits    How long can you stand comfortably? no real limits but doesn't really sit still much    How long can you walk comfortably? no limits   Patient Stated Goals get strength back    Currently in Pain? No/denies            [facility] PT Assessment - 11/15/14 0001    Observation/Other Assessments   Focus on Therapeutic Outcomes (FOTO)  2% limited    AROM   Right Hip External Rotation  --  full range    Right Hip Internal Rotation  47   Left Hip External Rotation  --  full range    Left Hip Internal Rotation  44   Lumbar Flexion 90   Lumbar Extension 19   Lumbar - Right Side Bend 26    Lumbar - Left Side Bend 18   Strength   Overall Strength Comments upper and lower abs approx 4+/5   Right Hip Flexion 4+/5   Right Hip Extension 4/5   Right Hip ABduction 5/5   Left Hip Flexion 4+/5   Left Hip Extension 4/5   Left Hip ABduction 4/5   Right Knee Flexion 4+/5   Right Knee Extension 5/5   Left Knee Flexion 4/5   Left Knee Extension 5/5   Right Ankle Dorsiflexion 5/5   Left Ankle Dorsiflexion 5/5   6 minute walk test results    Endurance additional comments 6 minute walk 1462ft, 1.38m/s                      [facility] Adult PT Treatment/Exercise - 11/15/14 0001    Lumbar Exercises: Stretches   Active Hamstring Stretch 3 reps;30 seconds   Active Hamstring Stretch Limitations 12 inch step    Passive Hamstring Stretch 3 reps;30 seconds   Passive Hamstring Stretch Limitations gastroc on slantboard    Piriformis Stretch 3 reps;30 seconds   Piriformis Stretch Limitations seated                PT Education - 11/15/14 1041    Education provided Yes   Education Details DC today, encouraged to keep up with HEP and remain as active as possible    Person(s) Educated Patient   Methods Explanation   Comprehension Verbalized understanding          PT Short Term Goals - 11/15/14 0920    PT SHORT TERM GOAL #1   Title Patient will be independent in correctly and consistently performing appropriate HEP, to be updated every session    Time 3   Period Weeks   Status Achieved   PT SHORT TERM GOAL #2   Title Patient to demonstrate bilateral lower extremity strength of at least 4/5 and proximal muscle strength of at least 4+/5    Time 3   Period Weeks   Status Partially Met           PT Long Term Goals - 11/15/14 7741    PT LONG TERM GOAL #1   Title Patient will demosntrate 5/5 strength in bilateral lower extremities, proximal muscles and core strength 5/5    Time 6   Period Weeks   Status Partially Met   PT LONG TERM GOAL #2   Title Patient to  demonstrate proper lifting mechanics with up to 25 pounds, no cues for safety or form on a consistent basis  Baseline 16-Nov-2022- did not examine however patient reports she is using good lifting mechanics that she learned at work    Time 6   Period Weeks   Status Deferred   PT LONG TERM GOAL #3   Title Patient will be independent in advanced HEP for home management of condition    Time 6   Period Weeks   Status Achieved   PT LONG TERM GOAL #4   Title Patient to report that she is consistently experiencing 0/10 pain during all functional weight bearing tasks and activities of unlimited duration    Time 6   Period Weeks   Status Achieved               Plan - 11-16-14 1042    Clinical Impression Statement Discharge assessment performed today. Patient shows minimal impairment and reports that she has been able to perform functional tasks around her home with no pain recently; she also reports that her HEP is going very well. At this time patient is not in need of skilled PT services and will be discharged to home management program.    Pt will benefit from skilled therapeutic intervention in order to improve on the following deficits Increased muscle spasms;Pain;Hypomobility;Improper body mechanics;Decreased mobility;Decreased strength   Rehab Potential Excellent   PT Treatment/Interventions ADLs/Self Care Home Management;Gait training;Stair training;Functional mobility training;Therapeutic activities;Therapeutic exercise;Balance training;Neuromuscular re-education;Patient/family education   PT Next Visit Plan DC today    PT Home Exercise Plan given    Consulted and Agree with Plan of Care Patient          G-Codes - 11-16-14 1045    Functional Assessment Tool Used FOTO 2% limited    Functional Limitation Mobility: Walking and moving around   Mobility: Walking and Moving Around Goal Status [phone]) At least 1 percent but less than 20 percent impaired, limited or restricted    Mobility: Walking and Moving Around Discharge Status [id number]) At least 1 percent but less than 20 percent impaired, limited or restricted      Problem List Patient Active Problem List   Diagnosis Date Noted  . Occlusion and stenosis of carotid artery without mention of cerebral infarction 11/17/2012  . Pain in joint, ankle and foot 02/08/2011  . Difficulty in walking(719.7) 02/08/2011  . Avulsion fracture of ankle 02/08/2011  . Stiffness of joint, not elsewhere classified, ankle and foot 02/08/2011  . Urinary tract infection, site not specified 09/10/2010    PHYSICAL THERAPY DISCHARGE SUMMARY  Visits from Start of Care: 5  Current functional level related to goals / functional outcomes: Patient displays minimal impairment at this time and is able to perform all functional and recreational tasks that she wishes    Remaining deficits: Mild lumbar stiffness and mild proximal muscle weakness    Education / Equipment: Encouraged to keep up with HEP and to remain as active as possible in order to maintain current functional level  Plan: Patient agrees to discharge.  Patient goals were met. Patient is being discharged due to meeting the stated rehab goals.  ?????       Deniece Ree PT, DPT [facility] [address] [place], [place], [address] Phone: [phone]   Fax:  [phone]

## 2014-11-17 ENCOUNTER — Ambulatory Visit (HOSPITAL_COMMUNITY): Payer: Commercial Managed Care - HMO | Admitting: Physical Therapy

## 2014-11-22 ENCOUNTER — Encounter (HOSPITAL_COMMUNITY): Payer: Commercial Managed Care - HMO | Admitting: Physical Therapy

## 2014-11-24 ENCOUNTER — Ambulatory Visit (HOSPITAL_COMMUNITY): Payer: Commercial Managed Care - HMO | Attending: Internal Medicine | Admitting: Physical Therapy

## 2014-11-29 ENCOUNTER — Encounter (HOSPITAL_COMMUNITY): Payer: Commercial Managed Care - HMO | Admitting: Physical Therapy

## 2014-12-01 ENCOUNTER — Encounter (HOSPITAL_COMMUNITY): Payer: Commercial Managed Care - HMO | Admitting: Physical Therapy

## 2014-12-06 ENCOUNTER — Encounter (HOSPITAL_COMMUNITY): Payer: Commercial Managed Care - HMO | Admitting: Physical Therapy

## 2015-01-12 DIAGNOSIS — J019 Acute sinusitis, unspecified: Secondary | ICD-10-CM | POA: Diagnosis not present

## 2015-01-20 DIAGNOSIS — M545 Low back pain: Secondary | ICD-10-CM | POA: Diagnosis not present

## 2015-01-23 ENCOUNTER — Ambulatory Visit (HOSPITAL_COMMUNITY)
Admission: RE | Admit: 2015-01-23 | Discharge: 2015-01-23 | Disposition: A | Discharge status: Home / Self Care | Payer: Commercial Managed Care - HMO | Source: Ambulatory Visit | Attending: Internal Medicine | Admitting: Internal Medicine

## 2015-01-23 ENCOUNTER — Other Ambulatory Visit (HOSPITAL_COMMUNITY): Payer: Self-pay | Admitting: Internal Medicine

## 2015-01-23 DIAGNOSIS — R319 Hematuria, unspecified: Secondary | ICD-10-CM

## 2015-01-23 DIAGNOSIS — R1031 Right lower quadrant pain: Secondary | ICD-10-CM | POA: Diagnosis not present

## 2015-01-23 DIAGNOSIS — R079 Chest pain, unspecified: Secondary | ICD-10-CM | POA: Diagnosis not present

## 2015-01-23 DIAGNOSIS — I700 Atherosclerosis of aorta: Secondary | ICD-10-CM | POA: Insufficient documentation

## 2015-04-05 DIAGNOSIS — J329 Chronic sinusitis, unspecified: Secondary | ICD-10-CM | POA: Diagnosis not present

## 2015-04-17 DIAGNOSIS — J329 Chronic sinusitis, unspecified: Secondary | ICD-10-CM | POA: Diagnosis not present

## 2015-04-28 DIAGNOSIS — J310 Chronic rhinitis: Secondary | ICD-10-CM | POA: Diagnosis not present

## 2015-04-28 DIAGNOSIS — Z23 Encounter for immunization: Secondary | ICD-10-CM | POA: Diagnosis not present

## 2015-07-24 DIAGNOSIS — H524 Presbyopia: Secondary | ICD-10-CM | POA: Diagnosis not present

## 2015-07-24 DIAGNOSIS — H40053 Ocular hypertension, bilateral: Secondary | ICD-10-CM | POA: Diagnosis not present

## 2015-07-24 DIAGNOSIS — H1045 Other chronic allergic conjunctivitis: Secondary | ICD-10-CM | POA: Diagnosis not present

## 2015-07-24 DIAGNOSIS — H40013 Open angle with borderline findings, low risk, bilateral: Secondary | ICD-10-CM | POA: Diagnosis not present

## 2015-07-24 DIAGNOSIS — H43811 Vitreous degeneration, right eye: Secondary | ICD-10-CM | POA: Diagnosis not present

## 2015-07-24 DIAGNOSIS — Z961 Presence of intraocular lens: Secondary | ICD-10-CM | POA: Diagnosis not present

## 2016-02-20 DIAGNOSIS — J0111 Acute recurrent frontal sinusitis: Secondary | ICD-10-CM | POA: Diagnosis not present

## 2016-05-29 DIAGNOSIS — R42 Dizziness and giddiness: Secondary | ICD-10-CM | POA: Diagnosis not present

## 2016-05-29 DIAGNOSIS — Z720 Tobacco use: Secondary | ICD-10-CM | POA: Diagnosis not present

## 2016-05-29 DIAGNOSIS — I6523 Occlusion and stenosis of bilateral carotid arteries: Secondary | ICD-10-CM | POA: Diagnosis not present

## 2016-05-29 DIAGNOSIS — R011 Cardiac murmur, unspecified: Secondary | ICD-10-CM | POA: Diagnosis not present

## 2016-05-30 ENCOUNTER — Other Ambulatory Visit: Payer: Self-pay

## 2016-05-30 DIAGNOSIS — I6529 Occlusion and stenosis of unspecified carotid artery: Secondary | ICD-10-CM

## 2016-05-31 ENCOUNTER — Telehealth (HOSPITAL_COMMUNITY): Payer: Self-pay | Admitting: Family Medicine

## 2016-05-31 ENCOUNTER — Other Ambulatory Visit: Payer: Self-pay | Admitting: Family Medicine

## 2016-05-31 DIAGNOSIS — R011 Cardiac murmur, unspecified: Secondary | ICD-10-CM

## 2016-06-05 ENCOUNTER — Ambulatory Visit (HOSPITAL_COMMUNITY)
Admission: RE | Admit: 2016-06-05 | Discharge: 2016-06-05 | Disposition: A | Discharge status: Home / Self Care | Payer: Medicare HMO | Source: Ambulatory Visit | Attending: Vascular Surgery | Admitting: Vascular Surgery

## 2016-06-05 ENCOUNTER — Encounter: Payer: Self-pay | Admitting: Vascular Surgery

## 2016-06-05 ENCOUNTER — Other Ambulatory Visit: Payer: Self-pay

## 2016-06-05 DIAGNOSIS — I6529 Occlusion and stenosis of unspecified carotid artery: Secondary | ICD-10-CM | POA: Diagnosis not present

## 2016-06-05 DIAGNOSIS — I6523 Occlusion and stenosis of bilateral carotid arteries: Secondary | ICD-10-CM | POA: Diagnosis not present

## 2016-06-05 NOTE — Telephone Encounter (Signed)
  05/31/2016 10:34 AM Phone (Outgoing) Taylor [name], Taylor [name] (Self) [phone] (H)   Left Message - Called pt and lmsg for her to CB to schedule an echo.     By Verdene Rio

## 2016-06-11 ENCOUNTER — Telehealth: Payer: Self-pay | Admitting: Vascular Surgery

## 2016-06-11 ENCOUNTER — Encounter: Payer: Self-pay | Admitting: Vascular Surgery

## 2016-06-11 ENCOUNTER — Ambulatory Visit (INDEPENDENT_AMBULATORY_CARE_PROVIDER_SITE_OTHER): Payer: Medicare HMO | Admitting: Vascular Surgery

## 2016-06-11 VITALS — BP 160/93 | HR 78 | Temp 98.0°F | Resp 16 | Ht 63.5 in | Wt 121.0 lb

## 2016-06-11 DIAGNOSIS — T508X5S Adverse effect of diagnostic agents, sequela: Secondary | ICD-10-CM

## 2016-06-11 DIAGNOSIS — I6529 Occlusion and stenosis of unspecified carotid artery: Secondary | ICD-10-CM | POA: Diagnosis not present

## 2016-06-11 NOTE — Telephone Encounter (Signed)
CTA Neck scheduled 3/30 @ [facility] and follow up with Dr. Donnetta Hutching scheduled 4/4. Left message to confirm with patient.

## 2016-06-11 NOTE — Progress Notes (Signed)
[facility] [place]  Patient name: Taylor [name] MRN: [id number] DOB: Jul 21, 1950 Sex: female  REASON FOR CONSULT: Evaluation of carotid stenosis  HPI: Taylor [name] is a 66 y.o. female, who is seen today for evaluation of carotid stenosis. I'd seen her in August 2014 for asymptomatic carotid disease. That time she was found to have proximally 70% right internal carotid artery stenosis and was recommended that we see her at 6 month intervals to rule out progression. She failed to use see [place] for follow-up. She's had a recent event where she described being at work and having a sensation of being warm all over. She felt sweaty and nauseous as well. Reported that her feet were very warm. She did not have any focal neurologic deficits. Did not have any sensation of her heart racing. Since that event she is felt.complete the worn out. She reports that she slept for 12 hours and continues to be exhausted. Further evaluation included recent repeat carotid duplex. This does show a high-grade carotid stenosis on the right and moderate stenosis on the left. She does have a radial calcification making the exact degree of narrowing somewhat difficult. She did not have any issues regarding speech or moving one side or the other. She is right-handed. She does smoke one half pack cigarettes per day.  Past Medical History:  Diagnosis Date  . Allergy    seasonal allergies  . Arthritis   . Carotid artery occlusion   . Carpal tunnel syndrome   . COPD (chronic obstructive pulmonary disease) ([place])   . GERD (gastroesophageal reflux disease)   . Wears dentures   . Wears glasses     Family History  Problem Relation Age of Onset  . Aneurysm Mother   . Heart disease Father 41    died of MI  . Heart attack Father   . Cancer Brother     unknown  . Liver disease Brother     1 brother died of cirrhosis    SOCIAL HISTORY: Social History   Social  History  . Marital status: Widowed    Spouse name: N/A  . Number of children: N/A  . Years of education: N/A   Occupational History  . Not on file.   Social History Main Topics  . Smoking status: Current Every Day Smoker    Packs/day: 0.50    Years: 41.00    Types: Cigarettes  . Smokeless tobacco: Never Used  . Alcohol use Yes     Comment: rare  . Drug use: No  . Sexual activity: Yes    Birth control/ protection: Surgical     Comment: widowed; has boyfriend; works for [employer], exercise - walk, hiking, kayaking   Other Topics Concern  . Not on file   Social History Narrative  . No narrative on file    Allergies  Allergen Reactions  . Contrast Media [Iodinated Diagnostic Agents]     Mental fog, confusion  . Tetracyclines & Related Nausea And Vomiting    Current Outpatient Prescriptions  Medication Sig Dispense Refill  . aspirin 81 MG chewable tablet Chew by mouth daily.    . fexofenadine (ALLERGY RELIEF) 60 MG tablet Take 60 mg by mouth 2 (two) times daily.    . hydroxypropyl methylcellulose / hypromellose (ISOPTO TEARS / GONIOVISC) 2.5 % ophthalmic solution 1 drop.    . montelukast (SINGULAIR) 10 MG tablet Take 10 mg by mouth at bedtime.     No current  facility-administered medications for this visit.     REVIEW OF SYSTEMS:  [X]  denotes positive finding, [ ]  denotes negative finding Cardiac  Comments:  Chest pain or chest pressure:    Shortness of breath upon exertion:    Short of breath when lying flat:    Irregular heart rhythm:        Vascular    Pain in calf, thigh, or hip brought on by ambulation: x   Pain in feet at night that wakes you up from your sleep:  x   Blood clot in your veins:    Leg swelling:         Pulmonary    Oxygen at home:    Productive cough:     Wheezing:         Neurologic    Sudden weakness in arms or legs:     Sudden numbness in arms or legs:     Sudden onset of difficulty speaking or slurred speech:    Temporary  loss of vision in one eye:     Problems with dizziness:  x       Gastrointestinal    Blood in stool:     Vomited blood:         Genitourinary    Burning when urinating:     Blood in urine:        Psychiatric    Major depression:         Hematologic    Bleeding problems:    Problems with blood clotting too easily:        Skin    Rashes or ulcers:        Constitutional    Fever or chills:      PHYSICAL EXAM: Vitals:   06/11/16 1410 06/11/16 1416  BP: (!) 149/84 (!) 160/93  Pulse: 78   Resp: 16   Temp: 98 F (36.7 C)   TempSrc: Oral   SpO2: 97%   Weight: 121 lb (54.9 kg)   Height: 5' 3.5" (1.613 m)     GENERAL: The patient is a well-nourished female, in no acute distress. The vital signs are documented above. CARDIOVASCULAR: Patient does have soft carotid bruits bilaterally. Radial pulses are 2+ dorsalis pedis pulses are 2+ bilaterally PULMONARY: There is good air exchange  ABDOMEN: Soft and non-tender  MUSCULOSKELETAL: There are no major deformities or cyanosis. NEUROLOGIC: No focal weakness or paresthesias are detected. SKIN: There are no ulcers or rashes noted. PSYCHIATRIC: The patient has a normal affect.  DATA:  Duplex suggested greater than 80% right internal carotid artery stenosis and moderate left carotid stenosis. There is a calcific plaque making exact determination somewhat more difficult.  MEDICAL ISSUES: Had long discussion with the patient and her husband present. I explained it would be extremely unlikely that her carotid stenosis would be the cause of her recent episode of tiredness and sensation of heat. She has not had any focal neurologic deficit. I have recommended CT angiogram for further evaluation of her carotid stenosis. She does list a contrast allergy but reports this was related to what sounds like a cardiology stress test years ago. Did not have any recollection of rash or difficulty breathing with the procedure. She is to undergo further  cardiac evaluation next week. I will see her back in the office in several weeks for further discussion after CT scan is been obtained.   Rosetta Posner, MD FACS [facility] Tel [phone]  Pager [phone]

## 2016-06-14 ENCOUNTER — Telehealth: Payer: Self-pay

## 2016-06-14 ENCOUNTER — Encounter: Payer: Self-pay | Admitting: Vascular Surgery

## 2016-06-14 NOTE — Telephone Encounter (Signed)
SENT NOTES TO SCHEDULING 

## 2016-06-17 ENCOUNTER — Ambulatory Visit (HOSPITAL_COMMUNITY): Payer: Medicare HMO | Attending: Cardiology

## 2016-06-17 ENCOUNTER — Other Ambulatory Visit: Payer: Self-pay

## 2016-06-17 DIAGNOSIS — I071 Rheumatic tricuspid insufficiency: Secondary | ICD-10-CM | POA: Insufficient documentation

## 2016-06-17 DIAGNOSIS — R011 Cardiac murmur, unspecified: Secondary | ICD-10-CM | POA: Diagnosis not present

## 2016-06-17 DIAGNOSIS — J449 Chronic obstructive pulmonary disease, unspecified: Secondary | ICD-10-CM | POA: Insufficient documentation

## 2016-06-17 DIAGNOSIS — I340 Nonrheumatic mitral (valve) insufficiency: Secondary | ICD-10-CM | POA: Diagnosis not present

## 2016-06-17 MED ORDER — PREDNISONE 50 MG PO TABS: ORAL_TABLET | ORAL | 0 refills | Status: DC

## 2016-06-17 MED ORDER — DIPHENHYDRAMINE HCL 25 MG PO CAPS: ORAL_CAPSULE | ORAL | 0 refills | Status: DC

## 2016-06-17 NOTE — Addendum Note (Signed)
Addended by: Mena Goes on: 06/17/2016 04:54 PM   Modules accepted: Orders

## 2016-06-18 ENCOUNTER — Encounter: Payer: Commercial Managed Care - HMO | Admitting: Vascular Surgery

## 2016-06-21 ENCOUNTER — Ambulatory Visit (INDEPENDENT_AMBULATORY_CARE_PROVIDER_SITE_OTHER): Payer: Medicare HMO | Admitting: Cardiovascular Disease

## 2016-06-21 ENCOUNTER — Ambulatory Visit (HOSPITAL_COMMUNITY): Admission: RE | Admit: 2016-06-21 | Payer: Medicare HMO | Source: Ambulatory Visit

## 2016-06-21 ENCOUNTER — Encounter: Payer: Self-pay | Admitting: Cardiovascular Disease

## 2016-06-21 VITALS — BP 130/72 | HR 110 | Ht 63.5 in | Wt 122.4 lb

## 2016-06-21 DIAGNOSIS — I6529 Occlusion and stenosis of unspecified carotid artery: Secondary | ICD-10-CM | POA: Diagnosis not present

## 2016-06-21 DIAGNOSIS — Z01812 Encounter for preprocedural laboratory examination: Secondary | ICD-10-CM

## 2016-06-21 LAB — PROTIME-INR
INR: 1 (ref 0.8–1.2)
Prothrombin Time: 10.7 s (ref 9.1–12.0)

## 2016-06-21 LAB — BASIC METABOLIC PANEL
BUN/Creatinine Ratio: 14 (ref 12–28)
BUN: 10 mg/dL (ref 8–27)
CALCIUM: 9.1 mg/dL (ref 8.7–10.3)
CO2: 22 mmol/L (ref 18–29)
CREATININE: 0.73 mg/dL (ref 0.57–1.00)
Chloride: 101 mmol/L (ref 96–106)
GFR calc Af Amer: 98 mL/min/{1.73_m2} (ref >59)
GFR calc non Af Amer: 85 mL/min/{1.73_m2} (ref >59)
GLUCOSE: 114 mg/dL — AB (ref 65–99)
Potassium: 3.8 mmol/L (ref 3.5–5.2)
Sodium: 143 mmol/L (ref 134–144)

## 2016-06-21 LAB — CBC WITH DIFFERENTIAL/PLATELET
Basophils Absolute: 0 10*3/uL (ref 0.0–0.2)
Basos: 0 %
EOS (ABSOLUTE): 0 10*3/uL (ref 0.0–0.4)
Eos: 0 %
HEMATOCRIT: 38.3 % (ref 34.0–46.6)
Hemoglobin: 13.3 g/dL (ref 11.1–15.9)
IMMATURE GRANULOCYTES: 0 %
Immature Grans (Abs): 0 10*3/uL (ref 0.0–0.1)
LYMPHS ABS: 0.7 10*3/uL (ref 0.7–3.1)
LYMPHS: 11 %
MCH: 31.8 pg (ref 26.6–33.0)
MCHC: 34.7 g/dL (ref 31.5–35.7)
MCV: 92 fL (ref 79–97)
MONOCYTES: 1 %
Monocytes Absolute: 0 10*3/uL — ABNORMAL LOW (ref 0.1–0.9)
NEUTROS PCT: 88 %
Neutrophils Absolute: 5.7 10*3/uL (ref 1.4–7.0)
PLATELETS: 266 10*3/uL (ref 150–379)
RBC: 4.18 x10E6/uL (ref 3.77–5.28)
RDW: 13.3 % (ref 12.3–15.4)
WBC: 6.5 10*3/uL (ref 3.4–10.8)

## 2016-06-21 MED ORDER — CARVEDILOL 6.25 MG PO TABS: 6.2500 mg | ORAL_TABLET | Freq: Two times a day (BID) | ORAL | 3 refills | Status: DC

## 2016-06-21 MED ORDER — PREDNISONE 20 MG PO TABS: ORAL_TABLET | ORAL | 0 refills | Status: DC

## 2016-06-21 MED ORDER — LOSARTAN POTASSIUM 50 MG PO TABS: 50.0000 mg | ORAL_TABLET | Freq: Every day | ORAL | 3 refills | Status: DC

## 2016-06-21 NOTE — Patient Instructions (Addendum)
Medication Instructions:  Your physician has recommended you make the following change in your medication:  1-START Coreg 6.25 mg by mouth twice daily with meals. 2-START Cozaar 50 mg by mouth daily 3-Take prednisone 60 mg by mouth the night before and morning of your heart cath procedure.  Lab work: Your physician recommends that you have lab work today- BMET, CBC, and INR/PT  Testing/Procedures: NONE  Follow-Up: Your physician wants you to follow-up in: 2 to 3 weeks with heart failure clinic.   If you need a refill on your cardiac medications before your next appointment, please call your pharmacy.    [facility] [address], [place] [address] [place] [address] Dept: [phone] Loc: [phone]  [facility]RO[facility] TOURE  06/21/2016  You are scheduled for a Cardiac Catheterization on Tuesday, April 3 with Dr. Glori Bickers.  1. Please arrive at the [facility] (Main Entrance A) at [facility]: [place], [place] [address] at 8:30 AM (two hours before your procedure to ensure your preparation). Free valet parking service is available.   Special note: Every effort is made to have your procedure done on time. Please understand that emergencies sometimes delay scheduled procedures.  2. Diet: Do not eat or drink anything after midnight prior to your procedure except sips of water to take medications.  3. Labs: None needed.  4. Medication instructions in preparation for your procedure:  On the morning of your procedure, take your Aspirin and any morning medicines NOT listed above.  You may use sips of water.  5. Plan for one night stay--bring personal belongings. 6. Bring a current list of your medications and current insurance cards. 7. You MUST have a responsible person to drive you home. 8. Someone MUST be with you the first 24 hours after you arrive home or your discharge  will be delayed. 9. Please wear clothes that are easy to get on and off and wear slip-on shoes.  Thank you for allowing [place] to care for you!   -- [facility]

## 2016-06-21 NOTE — Progress Notes (Signed)
Cardiology Office Note   Date:  06/21/2016   ID:  Taylor [name], DOB 1950-10-15, MRN [id number]  PCP:  Jonathon Bellows, MD  Cardiologist:   [name], MD   Chief Complaint  Patient presents with  . Occlusion and stenosis of carotid artery      History of Present Illness: Taylor [name] is a 66 y.o. female who presents for evaluation of CHF Consult requested by Dr Larose Hires physicians Has known vascular disease with duplex 06/05/16 suggesting high end RICA stenosis 70%   Reviewed echo 06/17/16 EF 30% some RWMAls  PA estimate 27 mmHg   She has not seen cardiologist before Smokes a ppd. Clinical COPD No chest pain. Fatigue and dyspnea last 3 months. Had ? Vagal episode at work 2 weeks ago. Felt hot all over with presyncope. No palpitations. She works at [employer] in [employer]  She has not had edema no PND or orthopnea  She has seen Dr Donnetta Hutching this week She has radial calcification of her RICA and needs CTA to evaluate but will likely need RCEA soon. Getting pre Rx for dye allergy   Past Medical History:  Diagnosis Date  . Allergy    seasonal allergies  . Arthritis   . Carotid artery occlusion   . Carpal tunnel syndrome   . COPD (chronic obstructive pulmonary disease) ([place])   . GERD (gastroesophageal reflux disease)   . Wears dentures   . Wears glasses     Past Surgical History:  Procedure Laterality Date  . ABDOMINAL HYSTERECTOMY  1974   still has ovaries  . CARPAL TUNNEL RELEASE  08/15/2011   Procedure: CARPAL TUNNEL RELEASE;  Surgeon: Cammie Sickle., MD;  Location: [place];  Service: Orthopedics;  Laterality: Right;  . CATARACT EXTRACTION W/PHACO Right 10/04/2013   Procedure: CATARACT EXTRACTION PHACO AND INTRAOCULAR LENS PLACEMENT (IOC);  Surgeon: Tonny Branch, MD;  Location: [facility];  Service: Ophthalmology;  Laterality: Right;  CDE 12.10  . CATARACT EXTRACTION W/PHACO Left 10/28/2013   Procedure: CATARACT EXTRACTION PHACO  AND INTRAOCULAR LENS PLACEMENT (IOC);  Surgeon: Tonny Branch, MD;  Location: [facility];  Service: Ophthalmology;  Laterality: Left;  CDE: 9.10  . COLONOSCOPY  2006   [place], repeat 2012  . CYST EXCISION     vaginal X3  . EXCISION MORTON'S NEUROMA  11/14/2011   Procedure: EXCISION MORTON'S NEUROMA;  Surgeon: Marcheta Grammes, DPM;  Location: [facility];  Service: Orthopedics;  Laterality: Right;  Excision of Neuroma 2nd Interspace Right Foot  . FOOT NEUROMA SURGERY  2008   right  . HERNIA REPAIR  2000   right inguinal  . KNEE SURGERY  2012   left, cartilage repair  . KNEE SURGERY  2005   left knee, cartilage repair  . KNEE SURGERY  2012   left, cartilage repair     Current Outpatient Prescriptions  Medication Sig Dispense Refill  . aspirin 81 MG chewable tablet Chew 81 mg by mouth daily.     . diphenhydrAMINE (BENADRYL) 25 mg capsule Take 50 mg (2 capsules) by mouth 1 hour prior to your procedure. 2 capsule 0  . fexofenadine (ALLERGY RELIEF) 60 MG tablet Take 60 mg by mouth 2 (two) times daily.    . montelukast (SINGULAIR) 10 MG tablet Take 10 mg by mouth at bedtime.    . predniSONE (DELTASONE) 50 MG tablet One tablet (50mg ) 13 hours prior to procedure; one tablet (50mg ) 7 hours prior  to procedure and then one tablet (50 mg) one hour prior to procedure. 3 tablet 0  . carvedilol (COREG) 6.25 MG tablet Take 1 tablet (6.25 mg total) by mouth 2 (two) times daily. 180 tablet 3  . losartan (COZAAR) 50 MG tablet Take 1 tablet (50 mg total) by mouth daily. 90 tablet 3  . predniSONE (DELTASONE) 20 MG tablet Take 60 mg (3 tabs) by mouth the night before procedure and 60 mg (3 tabs) morning of procedure. 6 tablet 0   No current facility-administered medications for this visit.     Allergies:   Contrast media [iodinated diagnostic agents]; Hydrocodone; and Tetracyclines & related    Social History:  The patient  reports that she has been smoking Cigarettes.  She has a 20.50 pack-year smoking  history. She has never used smokeless tobacco. She reports that she drinks alcohol. She reports that she does not use drugs.   Family History:  The patient's family history includes Aneurysm in her mother; Cancer in her brother; Heart attack in her father; Heart disease (age of onset: 19) in her father; Liver disease in her brother.    ROS:  Please see the history of present illness.   Otherwise, review of systems are positive for none.   All other systems are reviewed and negative.    PHYSICAL EXAM: VS:  BP 130/72   Pulse (!) 110   Ht 5' 3.5" (1.613 m)   Wt 122 lb 6.4 oz (55.5 kg)   SpO2 96%   BMI 21.34 kg/m  , BMI Body mass index is 21.34 kg/m. Affect appropriate Pale frail female  HEENT: normal Neck supple with no adenopathy JVP normal no bruits no thyromegaly Lungs clear with no wheezing and good diaphragmatic motion Heart:  S1/S2 no murmur, no rub, gallop or click PMI enlarged  Abdomen: benighn, BS positve, no tenderness, no AAA no bruit.  No HSM or HJR Distal pulses intact with no bruits No edema Neuro non-focal Skin warm and dry No muscular weakness    EKG:  ST rate 104 LBBB    Recent Labs: No results found for requested labs within last 8760 hours.    Lipid Panel    Component Value Date/Time   CHOL 194 08/27/2010 1017   TRIG 66 08/27/2010 1017   HDL 53 08/27/2010 1017   CHOLHDL 3.7 08/27/2010 1017   VLDL 13 08/27/2010 1017   LDLCALC 128 (H) 08/27/2010 1017      Wt Readings from Last 3 Encounters:  06/21/16 122 lb 6.4 oz (55.5 kg)  06/11/16 121 lb (54.9 kg)  09/27/13 111 lb (50.3 kg)      Other studies Reviewed: Additional studies/ records that were reviewed today include: Notes [facility] Justin Mend .    ASSESSMENT AND PLAN:  1. Preop: need to r/o CAD in setting of CHF and low EF right and left cath scheduled for Dr Reine Just or DM Tuesday Risks discussed and willing to proceed Will premed for dye allergy.  Likely non ischemic DCM but smoker with PVD  2.  CHF  Lack of volume overload and tachycardia suggest low output. Start coreg 6.25 bid and cozaar 50 mg Favor this over entresto for better HR control f/U with DM, DB in CHF clinic post cath   3. Carotid CTA arranged this week to define anatomy but with systolic velocity over 52m/sec and diastolic over 6.6Y/QIH she will likely need surgery Personally reviewed [place]  4. Smoking:  Discussed imperative nature of stopping to prevent  further vascular disease   Personally discussed with Dr Jeffie Pollock and can consider corlanor/digoxin If she does not tolerate coreg with low output  Current medicines are reviewed at length with the patient today.  The patient does not have concerns regarding medicines.  The following changes have been made:  no change  Labs/ tests ordered today include  Orders Placed This Encounter  Procedures  . Basic metabolic panel  . CBC with Differential/Platelet  . Protime-INR  . EKG 12-Lead     Disposition:   FU with  CHF clinic post cath     Signed, [name], MD  06/21/2016 9:16 AM    [facility] [address], [place], [place]  [address] Phone: [phone]; Fax: [phone]

## 2016-06-24 ENCOUNTER — Telehealth: Payer: Self-pay | Admitting: Cardiovascular Disease

## 2016-06-24 NOTE — Telephone Encounter (Signed)
New message    Pt c/o medication issue:  1. Name of Medication: losartan and coreg  2. How are you currently taking this medication (dosage and times per day)?  As prescribed   3. Are you having a reaction (difficulty breathing--STAT)? No   4. What is your medication issue?  Not sure which one is making her really dizzy and very fatigued and bad headaches

## 2016-06-24 NOTE — Telephone Encounter (Signed)
Patient stated that yesterday she felt very fatigued and she thinks it might be the new mediations Losartan and Coreg. Patient does not know what her BP or HR was at the time. Patient stated this only happen yesterday. Encouraged patient to spread out her medications. Informed patient to take her Coreg in the morning, Losartan in the afternoon, and Coreg in the evening, and see if this helps. Patient verbalized understanding.

## 2016-06-25 ENCOUNTER — Ambulatory Visit (HOSPITAL_COMMUNITY)
Admission: RE | Admit: 2016-06-25 | Discharge: 2016-06-25 | Disposition: A | Discharge status: Home / Self Care | Payer: Medicare HMO | Source: Ambulatory Visit | Attending: Internal Medicine | Admitting: Internal Medicine

## 2016-06-25 ENCOUNTER — Encounter (HOSPITAL_COMMUNITY)
Admission: RE | Disposition: A | Discharge status: Home / Self Care | Payer: Self-pay | Source: Ambulatory Visit | Attending: Internal Medicine

## 2016-06-25 DIAGNOSIS — Z01810 Encounter for preprocedural cardiovascular examination: Secondary | ICD-10-CM | POA: Diagnosis present

## 2016-06-25 DIAGNOSIS — Z8249 Family history of ischemic heart disease and other diseases of the circulatory system: Secondary | ICD-10-CM | POA: Diagnosis not present

## 2016-06-25 DIAGNOSIS — M1990 Unspecified osteoarthritis, unspecified site: Secondary | ICD-10-CM | POA: Diagnosis not present

## 2016-06-25 DIAGNOSIS — I6529 Occlusion and stenosis of unspecified carotid artery: Secondary | ICD-10-CM | POA: Insufficient documentation

## 2016-06-25 DIAGNOSIS — Z91041 Radiographic dye allergy status: Secondary | ICD-10-CM | POA: Diagnosis not present

## 2016-06-25 DIAGNOSIS — F17210 Nicotine dependence, cigarettes, uncomplicated: Secondary | ICD-10-CM | POA: Insufficient documentation

## 2016-06-25 DIAGNOSIS — E1151 Type 2 diabetes mellitus with diabetic peripheral angiopathy without gangrene: Secondary | ICD-10-CM | POA: Diagnosis not present

## 2016-06-25 DIAGNOSIS — I509 Heart failure, unspecified: Secondary | ICD-10-CM | POA: Diagnosis not present

## 2016-06-25 DIAGNOSIS — I2510 Atherosclerotic heart disease of native coronary artery without angina pectoris: Secondary | ICD-10-CM

## 2016-06-25 DIAGNOSIS — J449 Chronic obstructive pulmonary disease, unspecified: Secondary | ICD-10-CM | POA: Insufficient documentation

## 2016-06-25 DIAGNOSIS — R69 Illness, unspecified: Secondary | ICD-10-CM | POA: Diagnosis not present

## 2016-06-25 DIAGNOSIS — I420 Dilated cardiomyopathy: Secondary | ICD-10-CM | POA: Diagnosis not present

## 2016-06-25 DIAGNOSIS — Z7952 Long term (current) use of systemic steroids: Secondary | ICD-10-CM | POA: Insufficient documentation

## 2016-06-25 DIAGNOSIS — Z7982 Long term (current) use of aspirin: Secondary | ICD-10-CM | POA: Insufficient documentation

## 2016-06-25 DIAGNOSIS — K219 Gastro-esophageal reflux disease without esophagitis: Secondary | ICD-10-CM | POA: Insufficient documentation

## 2016-06-25 DIAGNOSIS — R0600 Dyspnea, unspecified: Secondary | ICD-10-CM

## 2016-06-25 HISTORY — PX: RIGHT/LEFT HEART CATH AND CORONARY ANGIOGRAPHY: CATH118266

## 2016-06-25 LAB — POCT I-STAT 3, VENOUS BLOOD GAS (G3P V)
ACID-BASE DEFICIT: 5 mmol/L — AB (ref 0.0–2.0)
ACID-BASE EXCESS: 4 mmol/L — AB (ref 0.0–2.0)
BICARBONATE: 29 mmol/L — AB (ref 20.0–28.0)
Bicarbonate: 20.5 mmol/L (ref 20.0–28.0)
O2 SAT: 73 %
O2 Saturation: 76 %
PCO2 VEN: 36.4 mmHg — AB (ref 44.0–60.0)
TCO2: 22 mmol/L (ref 0–100)
TCO2: 30 mmol/L (ref 0–100)
pCO2, Ven: 46.2 mmHg (ref 44.0–60.0)
pH, Ven: 7.359 (ref 7.250–7.430)
pH, Ven: 7.407 (ref 7.250–7.430)
pO2, Ven: 40 mmHg (ref 32.0–45.0)
pO2, Ven: 41 mmHg (ref 32.0–45.0)

## 2016-06-25 LAB — POCT I-STAT 3, ART BLOOD GAS (G3+)
ACID-BASE EXCESS: 3 mmol/L — AB (ref 0.0–2.0)
BICARBONATE: 28 mmol/L (ref 20.0–28.0)
O2 SAT: 99 %
PO2 ART: 113 mmHg — AB (ref 83.0–108.0)
TCO2: 29 mmol/L (ref 0–100)
pCO2 arterial: 41.3 mmHg (ref 32.0–48.0)
pH, Arterial: 7.439 (ref 7.350–7.450)

## 2016-06-25 SURGERY — RIGHT/LEFT HEART CATH AND CORONARY ANGIOGRAPHY
Anesthesia: LOCAL

## 2016-06-25 MED ORDER — HEPARIN (PORCINE) IN NACL 2-0.9 UNIT/ML-% IJ SOLN
INTRAMUSCULAR | Status: AC
  Filled 2016-06-25: qty 1000

## 2016-06-25 MED ORDER — HEPARIN (PORCINE) IN NACL 2-0.9 UNIT/ML-% IJ SOLN
INTRAMUSCULAR | Status: DC | PRN
  Administered 2016-06-25: 1000 mL

## 2016-06-25 MED ORDER — FENTANYL CITRATE (PF) 100 MCG/2ML IJ SOLN
INTRAMUSCULAR | Status: AC
  Filled 2016-06-25: qty 2

## 2016-06-25 MED ORDER — IOPAMIDOL (ISOVUE-370) INJECTION 76%
INTRAVENOUS | Status: AC
  Filled 2016-06-25: qty 100

## 2016-06-25 MED ORDER — HEPARIN SODIUM (PORCINE) 1000 UNIT/ML IJ SOLN
INTRAMUSCULAR | Status: DC | PRN
  Administered 2016-06-25: 3000 [IU] via INTRAVENOUS

## 2016-06-25 MED ORDER — FENTANYL CITRATE (PF) 100 MCG/2ML IJ SOLN
INTRAMUSCULAR | Status: DC | PRN
  Administered 2016-06-25: 25 ug via INTRAVENOUS

## 2016-06-25 MED ORDER — SODIUM CHLORIDE 0.9 % IV SOLN: 250.0000 mL | INTRAVENOUS | Status: DC | PRN

## 2016-06-25 MED ORDER — IOPAMIDOL (ISOVUE-370) INJECTION 76%
INTRAVENOUS | Status: DC | PRN
  Administered 2016-06-25: 60 mL via INTRA_ARTERIAL

## 2016-06-25 MED ORDER — LIDOCAINE HCL (PF) 1 % IJ SOLN
INTRAMUSCULAR | Status: DC | PRN
  Administered 2016-06-25 (×2): 2 mL

## 2016-06-25 MED ORDER — SODIUM CHLORIDE 0.9% FLUSH: 3.0000 mL | INTRAVENOUS | Status: DC | PRN

## 2016-06-25 MED ORDER — VERAPAMIL HCL 2.5 MG/ML IV SOLN
INTRAVENOUS | Status: DC | PRN
  Administered 2016-06-25: 10 mL via INTRA_ARTERIAL

## 2016-06-25 MED ORDER — SODIUM CHLORIDE 0.9 % IV SOLN: INTRAVENOUS | Status: AC

## 2016-06-25 MED ORDER — SODIUM CHLORIDE 0.9% FLUSH: 3.0000 mL | Freq: Two times a day (BID) | INTRAVENOUS | Status: DC

## 2016-06-25 MED ORDER — VERAPAMIL HCL 2.5 MG/ML IV SOLN
INTRAVENOUS | Status: AC
  Filled 2016-06-25: qty 2

## 2016-06-25 MED ORDER — SODIUM CHLORIDE 0.9 % WEIGHT BASED INFUSION
3.0000 mL/kg/h | INTRAVENOUS | Status: AC
  Administered 2016-06-25: 3 mL/kg/h via INTRAVENOUS

## 2016-06-25 MED ORDER — ASPIRIN 81 MG PO CHEW
81.0000 mg | CHEWABLE_TABLET | ORAL | Status: DC
Start: 2016-06-25 — End: 2016-06-25

## 2016-06-25 MED ORDER — LIDOCAINE HCL (PF) 1 % IJ SOLN
INTRAMUSCULAR | Status: AC
  Filled 2016-06-25: qty 30

## 2016-06-25 MED ORDER — HEPARIN SODIUM (PORCINE) 1000 UNIT/ML IJ SOLN
INTRAMUSCULAR | Status: AC
  Filled 2016-06-25: qty 1

## 2016-06-25 MED ORDER — SODIUM CHLORIDE 0.9 % WEIGHT BASED INFUSION: 1.0000 mL/kg/h | INTRAVENOUS | Status: DC

## 2016-06-25 SURGICAL SUPPLY — 12 items
CATH 5FR JL3.5 JR4 ANG PIG MP (CATHETERS) ×1 IMPLANT
CATH BALLN WEDGE 5F 110CM (CATHETERS) ×1 IMPLANT
DEVICE RAD COMP TR BAND LRG (VASCULAR PRODUCTS) ×1 IMPLANT
GLIDESHEATH SLEND SS 6F .021 (SHEATH) ×1 IMPLANT
GUIDEWIRE INQWIRE 1.5J.035X260 (WIRE) IMPLANT
INQWIRE 1.5J .035X260CM (WIRE) ×2
KIT HEART LEFT (KITS) ×2 IMPLANT
PACK CARDIAC CATHETERIZATION (CUSTOM PROCEDURE TRAY) ×2 IMPLANT
SHEATH FAST CATH BRACH 5F 5CM (SHEATH) ×1 IMPLANT
TRANSDUCER W/STOPCOCK (MISCELLANEOUS) ×2 IMPLANT
TUBING CIL FLEX 10 FLL-RA (TUBING) ×2 IMPLANT
WIRE HI TORQ VERSACORE-J 145CM (WIRE) ×1 IMPLANT

## 2016-06-25 NOTE — Interval H&P Note (Signed)
History and Physical Interval Note:  06/25/2016 9:12 AM  Taylor [name]  has presented today for surgery, with the diagnosis of chf, tachycardia  The various methods of treatment have been discussed with the patient and family. After consideration of risks, benefits and other options for treatment, the patient has consented to  Procedure(s): Right/Left Heart Cath and Coronary Angiography (N/A) and possible angioplasty as a surgical intervention .  The patient's history has been reviewed, patient examined, no change in status, stable for surgery.  I have reviewed the patient's chart and labs.  Questions were answered to the patient's satisfaction.     Apostolos Blagg, Quillian Quince

## 2016-06-25 NOTE — Discharge Instructions (Signed)
Radial Site Care °Refer to this sheet in the next few weeks. These instructions provide you with information about caring for yourself after your procedure. Your health care provider may also give you more specific instructions. Your treatment has been planned according to current medical practices, but problems sometimes occur. Call your health care provider if you have any problems or questions after your procedure. °What can I expect after the procedure? °After your procedure, it is typical to have the following: °· Bruising at the radial site that usually fades within 1-2 weeks. °· Blood collecting in the tissue (hematoma) that may be painful to the touch. It should usually decrease in size and tenderness within 1-2 weeks. °Follow these instructions at home: °· Take medicines only as directed by your health care provider. °· You may shower 24-48 hours after the procedure or as directed by your health care provider. Remove the bandage (dressing) and gently wash the site with plain soap and water. Pat the area dry with a clean towel. Do not rub the site, because this may cause bleeding. °· Do not take baths, swim, or use a hot tub until your health care provider approves. °· Check your insertion site every day for redness, swelling, or drainage. °· Do not apply powder or lotion to the site. °· Do not flex or bend the affected arm for 24 hours or as directed by your health care provider. °· Do not push or pull heavy objects with the affected arm for 24 hours or as directed by your health care provider. °· Do not lift over 10 lb (4.5 kg) for 5 days after your procedure or as directed by your health care provider. °· Ask your health care provider when it is okay to: °¨ Return to work or school. °¨ Resume usual physical activities or sports. °¨ Resume sexual activity. °· Do not drive home if you are discharged the same day as the procedure. Have someone else drive you. °· You may drive 24 hours after the procedure  unless otherwise instructed by your health care provider. °· Do not operate machinery or power tools for 24 hours after the procedure. °· If your procedure was done as an outpatient procedure, which means that you went home the same day as your procedure, a responsible adult should be with you for the first 24 hours after you arrive home. °· Keep all follow-up visits as directed by your health care provider. This is important. °Contact a health care provider if: °· You have a fever. °· You have chills. °· You have increased bleeding from the radial site. Hold pressure on the site. °Get help right away if: °· You have unusual pain at the radial site. °· You have redness, warmth, or swelling at the radial site. °· You have drainage (other than a small amount of blood on the dressing) from the radial site. °· The radial site is bleeding, and the bleeding does not stop after 30 minutes of holding steady pressure on the site. °· Your arm or hand becomes pale, cool, tingly, or numb. °This information is not intended to replace advice given to you by your health care provider. Make sure you discuss any questions you have with your health care provider. °Document Released: 04/13/2010 Document Revised: 08/17/2015 Document Reviewed: 09/27/2013 °Elsevier Interactive Patient Education © 2017 [employer] ° °

## 2016-06-25 NOTE — H&P (View-Only) (Signed)
Cardiology Office Note   Date:  06/21/2016   ID:  Taylor [name], DOB [birth year], MRN [id number]  PCP:  Taylor Bellows, MD  Cardiologist:   Taylor Rouge, MD   Chief Complaint  Patient presents with  . Occlusion and stenosis of carotid artery      History of Present Illness: Taylor [name] is a 66 y.o. female who presents for evaluation of CHF Consult requested by Dr Taylor [name] physicians Has known vascular disease with duplex 06/05/16 suggesting high end RICA stenosis 70%   Reviewed echo 06/17/16 EF 30% some RWMAls  PA estimate 27 mmHg   She has not seen cardiologist before Smokes a ppd. Clinical COPD No chest pain. Fatigue and dyspnea last 3 months. Had ? Vagal episode at work 2 weeks ago. Felt hot all over with presyncope. No palpitations. She works at [employer] in [employer]  She has not had edema no PND or orthopnea  She has seen Dr Taylor [name] this week She has radial calcification of her RICA and needs CTA to evaluate but will likely need RCEA soon. Getting pre Rx for dye allergy   Past Medical History:  Diagnosis Date  . Allergy    seasonal allergies  . Arthritis   . Carotid artery occlusion   . Carpal tunnel syndrome   . COPD (chronic obstructive pulmonary disease) ([place])   . GERD (gastroesophageal reflux disease)   . Wears dentures   . Wears glasses     Past Surgical History:  Procedure Laterality Date  . ABDOMINAL HYSTERECTOMY  1974   still has ovaries  . CARPAL TUNNEL RELEASE  08/15/2011   Procedure: CARPAL TUNNEL RELEASE;  Surgeon: Taylor [name]., MD;  Location: [place];  Service: Orthopedics;  Laterality: Right;  . CATARACT EXTRACTION W/PHACO Right 10/04/2013   Procedure: CATARACT EXTRACTION PHACO AND INTRAOCULAR LENS PLACEMENT (IOC);  Surgeon: Taylor Branch, MD;  Location: [facility];  Service: Ophthalmology;  Laterality: Right;  CDE 12.10  . CATARACT EXTRACTION W/PHACO Left 10/28/2013   Procedure: CATARACT EXTRACTION PHACO  AND INTRAOCULAR LENS PLACEMENT (IOC);  Surgeon: Taylor Branch, MD;  Location: [facility];  Service: Ophthalmology;  Laterality: Left;  CDE: 9.10  . COLONOSCOPY  2006   [place], repeat 2012  . CYST EXCISION     vaginal X3  . EXCISION MORTON'S NEUROMA  11/14/2011   Procedure: EXCISION MORTON'S NEUROMA;  Surgeon: Taylor [name], DPM;  Location: [facility];  Service: Orthopedics;  Laterality: Right;  Excision of Neuroma 2nd Interspace Right Foot  . FOOT NEUROMA SURGERY  2008   right  . HERNIA REPAIR  2000   right inguinal  . KNEE SURGERY  2012   left, cartilage repair  . KNEE SURGERY  2005   left knee, cartilage repair  . KNEE SURGERY  2012   left, cartilage repair     Current Outpatient Prescriptions  Medication Sig Dispense Refill  . aspirin 81 MG chewable tablet Chew 81 mg by mouth daily.     . diphenhydrAMINE (BENADRYL) 25 mg capsule Take 50 mg (2 capsules) by mouth 1 hour prior to your procedure. 2 capsule 0  . fexofenadine (ALLERGY RELIEF) 60 MG tablet Take 60 mg by mouth 2 (two) times daily.    . montelukast (SINGULAIR) 10 MG tablet Take 10 mg by mouth at bedtime.    . predniSONE (DELTASONE) 50 MG tablet One tablet (50mg ) 13 hours prior to procedure; one tablet (50mg ) 7 hours prior  to procedure and then one tablet (50 mg) one hour prior to procedure. 3 tablet 0  . carvedilol (COREG) 6.25 MG tablet Take 1 tablet (6.25 mg total) by mouth 2 (two) times daily. 180 tablet 3  . losartan (COZAAR) 50 MG tablet Take 1 tablet (50 mg total) by mouth daily. 90 tablet 3  . predniSONE (DELTASONE) 20 MG tablet Take 60 mg (3 tabs) by mouth the night before procedure and 60 mg (3 tabs) morning of procedure. 6 tablet 0   No current facility-administered medications for this visit.     Allergies:   Contrast media [iodinated diagnostic agents]; Hydrocodone; and Tetracyclines & related    Social History:  The patient  reports that she has been smoking Cigarettes.  She has a 20.50 pack-year smoking  history. She has never used smokeless tobacco. She reports that she drinks alcohol. She reports that she does not use drugs.   Family History:  The patient's family history includes Aneurysm in her mother; Cancer in her brother; Heart attack in her father; Heart disease (age of onset: 29) in her father; Liver disease in her brother.    ROS:  Please see the history of present illness.   Otherwise, review of systems are positive for none.   All other systems are reviewed and negative.    PHYSICAL EXAM: VS:  BP 130/72   Pulse (!) 110   Ht 5' 3.5" (1.613 m)   Wt 122 lb 6.4 oz (55.5 kg)   SpO2 96%   BMI 21.34 kg/m  , BMI Body mass index is 21.34 kg/m. Affect appropriate Pale frail female  HEENT: normal Neck supple with no adenopathy JVP normal no bruits no thyromegaly Lungs clear with no wheezing and good diaphragmatic motion Heart:  S1/S2 no murmur, no rub, gallop or click PMI enlarged  Abdomen: benighn, BS positve, no tenderness, no AAA no bruit.  No HSM or HJR Distal pulses intact with no bruits No edema Neuro non-focal Skin warm and dry No muscular weakness    EKG:  ST rate 104 LBBB    Recent Labs: No results found for requested labs within last 8760 hours.    Lipid Panel    Component Value Date/Time   CHOL 194 08/27/2010 1017   TRIG 66 08/27/2010 1017   HDL 53 08/27/2010 1017   CHOLHDL 3.7 08/27/2010 1017   VLDL 13 08/27/2010 1017   LDLCALC 128 (H) 08/27/2010 1017      Wt Readings from Last 3 Encounters:  06/21/16 122 lb 6.4 oz (55.5 kg)  06/11/16 121 lb (54.9 kg)  09/27/13 111 lb (50.3 kg)      Other studies Reviewed: Additional studies/ records that were reviewed today include: Notes [facility] Taylor [name] .    ASSESSMENT AND PLAN:  1. Preop: need to r/o CAD in setting of CHF and low EF right and left cath scheduled for Dr Reine Just or DM Tuesday Risks discussed and willing to proceed Will premed for dye allergy.  Likely non ischemic DCM but smoker with PVD  2.  CHF  Lack of volume overload and tachycardia suggest low output. Start coreg 6.25 bid and cozaar 50 mg Favor this over entresto for better HR control f/U with DM, DB in CHF clinic post cath   3. Carotid CTA arranged this week to define anatomy but with systolic velocity over 39m/sec and diastolic over 4.1O/INO she will likely need surgery Personally reviewed [place]  4. Smoking:  Discussed imperative nature of stopping to prevent  further vascular disease   Personally discussed with Dr Jeffie Pollock and can consider corlanor/digoxin If she does not tolerate coreg with low output  Current medicines are reviewed at length with the patient today.  The patient does not have concerns regarding medicines.  The following changes have been made:  no change  Labs/ tests ordered today include  Orders Placed This Encounter  Procedures  . Basic metabolic panel  . CBC with Differential/Platelet  . Protime-INR  . EKG 12-Lead     Disposition:   FU with  CHF clinic post cath     Signed, Taylor Rouge, MD  06/21/2016 9:16 AM    [facility] [place], [place], [place]  [address] Phone: [phone]; Fax: [phone]

## 2016-06-26 ENCOUNTER — Ambulatory Visit: Payer: Medicare HMO | Admitting: Vascular Surgery

## 2016-06-26 ENCOUNTER — Telehealth: Payer: Self-pay | Admitting: Cardiovascular Disease

## 2016-06-26 ENCOUNTER — Encounter (HOSPITAL_COMMUNITY): Payer: Self-pay | Admitting: Internal Medicine

## 2016-06-26 NOTE — Telephone Encounter (Signed)
Patient was concerned about Dr. Mahalia Longest discontinuing her medications, Coreg and losartan. Per [name], it is fine for her to discontinue this medications, that from her heart cath results, her heart is fine. Patient verbalized understanding. Will update patient's medication list.

## 2016-06-26 NOTE — Telephone Encounter (Signed)
New message   Pt is calling to find out if she should continue on medication that was prescribed to her because Dr. Mahalia Longest told her she did not need it.

## 2016-06-27 ENCOUNTER — Telehealth: Payer: Self-pay | Admitting: Cardiovascular Disease

## 2016-06-27 NOTE — Telephone Encounter (Signed)
Called patient and made her an appointment at the end of June.

## 2016-06-27 NOTE — Telephone Encounter (Signed)
New Message:   Pt says she is very confused about what is going on with her condition.She said she would explain everything to you.

## 2016-06-27 NOTE — Telephone Encounter (Signed)
Can see me in 3 months

## 2016-06-27 NOTE — Telephone Encounter (Signed)
Patient was Taylor [name] is she still needed to follow up with CHF clinic, that she was told by [name] that she did not have to after her heart cath was fine. Informed patient to follow [name] instructions. Informed patient that a message would be sent to [name] to see if she needs to follow-up with him PRN, 6 months, or 1 year.

## 2016-06-28 DIAGNOSIS — S0502XA Injury of conjunctiva and corneal abrasion without foreign body, left eye, initial encounter: Secondary | ICD-10-CM | POA: Diagnosis not present

## 2016-07-04 ENCOUNTER — Encounter: Payer: Self-pay | Admitting: Vascular Surgery

## 2016-07-04 ENCOUNTER — Ambulatory Visit (HOSPITAL_COMMUNITY)
Admission: RE | Admit: 2016-07-04 | Discharge: 2016-07-04 | Disposition: A | Discharge status: Home / Self Care | Payer: Medicare HMO | Source: Ambulatory Visit | Attending: Vascular Surgery | Admitting: Vascular Surgery

## 2016-07-04 DIAGNOSIS — J439 Emphysema, unspecified: Secondary | ICD-10-CM | POA: Diagnosis not present

## 2016-07-04 DIAGNOSIS — I700 Atherosclerosis of aorta: Secondary | ICD-10-CM | POA: Diagnosis not present

## 2016-07-04 DIAGNOSIS — I6523 Occlusion and stenosis of bilateral carotid arteries: Secondary | ICD-10-CM | POA: Diagnosis not present

## 2016-07-04 DIAGNOSIS — I6521 Occlusion and stenosis of right carotid artery: Secondary | ICD-10-CM | POA: Diagnosis not present

## 2016-07-04 DIAGNOSIS — I6529 Occlusion and stenosis of unspecified carotid artery: Secondary | ICD-10-CM

## 2016-07-04 MED ORDER — IOPAMIDOL (ISOVUE-370) INJECTION 76%
80.0000 mL | Freq: Once | INTRAVENOUS | Status: AC | PRN
  Administered 2016-07-04: 75 mL via INTRAVENOUS

## 2016-07-15 ENCOUNTER — Encounter (HOSPITAL_COMMUNITY): Payer: Medicare HMO | Admitting: Internal Medicine

## 2016-07-16 ENCOUNTER — Encounter: Payer: Self-pay | Admitting: Vascular Surgery

## 2016-07-16 ENCOUNTER — Ambulatory Visit (INDEPENDENT_AMBULATORY_CARE_PROVIDER_SITE_OTHER): Payer: Medicare HMO | Admitting: Vascular Surgery

## 2016-07-16 VITALS — BP 127/76 | HR 106 | Temp 98.7°F | Resp 18 | Ht 63.5 in | Wt 121.0 lb

## 2016-07-16 DIAGNOSIS — I6523 Occlusion and stenosis of bilateral carotid arteries: Secondary | ICD-10-CM

## 2016-07-16 NOTE — Progress Notes (Signed)
[facility] [place]  Patient name: Taylor [name] MRN: [id number] DOB: 08-Feb-1951 Sex: female  REASON FOR VISIT: Discuss recent CT angiogram  HPI: Taylor [name] is a 66 y.o. female ear today for discussion of recent CT angiogram. I'd seen her approximately 3 weeks ago. At that time she had a duplex showing possible severe stenosis. She did have systolic velocities suggesting greater than 80% stenosis but end diastolic velocity suggesting 60-79% stenosis. She did have calcification as well. She underwent a CT angiogram for further evaluation. She is here today with her husband and son. Continues to have generalized fatigue but no focal neurologic deficits  Past Medical History:  Diagnosis Date  . Allergy    seasonal allergies  . Arthritis   . Carotid artery occlusion   . Carpal tunnel syndrome   . COPD (chronic obstructive pulmonary disease) ([place])   . GERD (gastroesophageal reflux disease)   . Wears dentures   . Wears glasses     Family History  Problem Relation Age of Onset  . Aneurysm Mother   . Heart disease Father 48    died of MI  . Heart attack Father   . Cancer Brother     unknown  . Liver disease Brother     1 brother died of cirrhosis    SOCIAL HISTORY: Social History  Substance Use Topics  . Smoking status: Current Every Day Smoker    Packs/day: 0.50    Years: 41.00    Types: Cigarettes  . Smokeless tobacco: Never Used  . Alcohol use Yes     Comment: rare    Allergies  Allergen Reactions  . Contrast Media [Iodinated Diagnostic Agents] Nausea And Vomiting    Mental fog, confusion   . Hydrocodone Nausea And Vomiting  . Tetracyclines & Related Nausea And Vomiting  . Gabapentin     Hallucinations     Current Outpatient Prescriptions  Medication Sig Dispense Refill  . aspirin 81 MG chewable tablet Chew 81 mg by mouth daily with lunch.     . ARTIFICIAL TEAR OP Apply 1 drop to eye daily as  needed (dry eyes).    . cetirizine (ZYRTEC) 10 MG tablet Take 10 mg by mouth daily.    . diphenhydrAMINE (BENADRYL) 25 mg capsule Take 50 mg (2 capsules) by mouth 1 hour prior to your procedure. (Patient not taking: Reported on 07/16/2016) 2 capsule 0  . Diphenhydramine-APAP, sleep, (EXCEDRIN PM PO) Take 1-2 tablets by mouth at bedtime as needed (headache/sleep).    Marland Kitchen ibuprofen (ADVIL,MOTRIN) 200 MG tablet Take 400 mg by mouth daily as needed for headache or moderate pain.    . montelukast (SINGULAIR) 10 MG tablet Take 10 mg by mouth at bedtime.    . predniSONE (DELTASONE) 20 MG tablet Take 60 mg (3 tabs) by mouth the night before procedure and 60 mg (3 tabs) morning of procedure. (Patient not taking: Reported on 07/16/2016) 6 tablet 0  . predniSONE (DELTASONE) 50 MG tablet One tablet (50mg ) 13 hours prior to procedure; one tablet (50mg ) 7 hours prior to procedure and then one tablet (50 mg) one hour prior to procedure. (Patient not taking: Reported on 07/16/2016) 3 tablet 0   No current facility-administered medications for this visit.     REVIEW OF SYSTEMS:  [X]  denotes positive finding, [ ]  denotes negative finding Cardiac  Comments:  Chest pain or chest pressure:    Shortness of breath upon exertion:    Short of  breath when lying flat:    Irregular heart rhythm:        Vascular    Pain in calf, thigh, or hip brought on by ambulation:    Pain in feet at night that wakes you up from your sleep:     Blood clot in your veins:    Leg swelling:           PHYSICAL EXAM: Vitals:   07/16/16 0828 07/16/16 0831  BP: 132/83 127/76  Pulse: (!) 106   Resp: 18   Temp: 98.7 F (37.1 C)   TempSrc: Oral   SpO2: 97%   Weight: 121 lb (54.9 kg)   Height: 5' 3.5" (1.613 m)     GENERAL: The patient is a well-nourished female, in no acute distress. The vital signs are documented above. CARDIOVASCULAR: Carotid bruits bilaterally PULMONARY: There is good air exchange  MUSCULOSKELETAL: There are  no major deformities or cyanosis. NEUROLOGIC: No focal weakness or paresthesias are detected. SKIN: There are no ulcers or rashes noted. PSYCHIATRIC: The patient has a normal affect.  DATA:  T angiogram suggest 60-70% stenosis in her right internal carotid artery at the bifurcation.  MEDICAL ISSUES: Asymptomatic 60-70% right internal carotid artery stenosis. I discussed this at length with the patient and her family present. She is very upset that she is known to have extensive evaluation with possible cardiac failure stroke and no evidence of the cause for her fatigue and symptoms. I explained that I certainly understand her frustration but do not have an etiology for her symptoms. I did again review symptoms of right carotid disease with her and she'll notify should she have any difficulty. Otherwise have recommended duplex 6 month intervals to rule out progression of her asymptomatic disease    Rosetta Posner, MD FACS [facility] Tel [phone] Pager [phone]

## 2016-07-17 NOTE — Addendum Note (Signed)
Addended by: Lianne Cure A on: 07/17/2016 09:20 AM   Modules accepted: Orders

## 2016-09-16 DIAGNOSIS — M961 Postlaminectomy syndrome, not elsewhere classified: Secondary | ICD-10-CM | POA: Diagnosis not present

## 2016-09-16 DIAGNOSIS — M4726 Other spondylosis with radiculopathy, lumbar region: Secondary | ICD-10-CM | POA: Diagnosis not present

## 2016-09-16 DIAGNOSIS — M549 Dorsalgia, unspecified: Secondary | ICD-10-CM | POA: Diagnosis not present

## 2016-09-16 DIAGNOSIS — M5432 Sciatica, left side: Secondary | ICD-10-CM | POA: Diagnosis not present

## 2016-09-17 ENCOUNTER — Other Ambulatory Visit: Payer: Self-pay | Admitting: Orthopedic Surgery

## 2016-09-17 DIAGNOSIS — M4726 Other spondylosis with radiculopathy, lumbar region: Secondary | ICD-10-CM

## 2016-09-20 ENCOUNTER — Ambulatory Visit: Payer: Medicare HMO | Admitting: Cardiovascular Disease

## 2016-09-22 ENCOUNTER — Ambulatory Visit
Admission: RE | Admit: 2016-09-22 | Discharge: 2016-09-22 | Disposition: A | Discharge status: Home / Self Care | Payer: Medicare HMO | Source: Ambulatory Visit | Attending: Orthopedic Surgery | Admitting: Orthopedic Surgery

## 2016-09-22 DIAGNOSIS — M5126 Other intervertebral disc displacement, lumbar region: Secondary | ICD-10-CM | POA: Diagnosis not present

## 2016-09-22 DIAGNOSIS — M4726 Other spondylosis with radiculopathy, lumbar region: Secondary | ICD-10-CM

## 2016-09-27 ENCOUNTER — Other Ambulatory Visit: Payer: Medicare HMO

## 2016-09-27 DIAGNOSIS — M4726 Other spondylosis with radiculopathy, lumbar region: Secondary | ICD-10-CM | POA: Diagnosis not present

## 2016-09-27 DIAGNOSIS — M5432 Sciatica, left side: Secondary | ICD-10-CM | POA: Diagnosis not present

## 2016-09-27 DIAGNOSIS — M961 Postlaminectomy syndrome, not elsewhere classified: Secondary | ICD-10-CM | POA: Diagnosis not present

## 2016-09-27 DIAGNOSIS — M5106 Intervertebral disc disorders with myelopathy, lumbar region: Secondary | ICD-10-CM | POA: Diagnosis not present

## 2016-11-20 DIAGNOSIS — H524 Presbyopia: Secondary | ICD-10-CM | POA: Diagnosis not present

## 2016-12-04 DIAGNOSIS — I5022 Chronic systolic (congestive) heart failure: Secondary | ICD-10-CM | POA: Diagnosis not present

## 2016-12-04 DIAGNOSIS — Z0000 Encounter for general adult medical examination without abnormal findings: Secondary | ICD-10-CM | POA: Diagnosis not present

## 2016-12-04 DIAGNOSIS — E785 Hyperlipidemia, unspecified: Secondary | ICD-10-CM | POA: Diagnosis not present

## 2016-12-04 DIAGNOSIS — R69 Illness, unspecified: Secondary | ICD-10-CM | POA: Diagnosis not present

## 2016-12-04 DIAGNOSIS — Z1211 Encounter for screening for malignant neoplasm of colon: Secondary | ICD-10-CM | POA: Diagnosis not present

## 2016-12-04 DIAGNOSIS — Z5181 Encounter for therapeutic drug level monitoring: Secondary | ICD-10-CM | POA: Diagnosis not present

## 2016-12-04 DIAGNOSIS — I447 Left bundle-branch block, unspecified: Secondary | ICD-10-CM | POA: Diagnosis not present

## 2016-12-10 DIAGNOSIS — M1120 Other chondrocalcinosis, unspecified site: Secondary | ICD-10-CM | POA: Diagnosis not present

## 2016-12-10 DIAGNOSIS — M25532 Pain in left wrist: Secondary | ICD-10-CM | POA: Diagnosis not present

## 2016-12-17 DIAGNOSIS — M1120 Other chondrocalcinosis, unspecified site: Secondary | ICD-10-CM | POA: Diagnosis not present

## 2017-01-20 DIAGNOSIS — M79671 Pain in right foot: Secondary | ICD-10-CM | POA: Diagnosis not present

## 2017-01-20 DIAGNOSIS — M79674 Pain in right toe(s): Secondary | ICD-10-CM | POA: Diagnosis not present

## 2017-01-20 DIAGNOSIS — L859 Epidermal thickening, unspecified: Secondary | ICD-10-CM | POA: Diagnosis not present

## 2017-01-20 DIAGNOSIS — B078 Other viral warts: Secondary | ICD-10-CM | POA: Diagnosis not present

## 2017-01-21 ENCOUNTER — Ambulatory Visit: Payer: Medicare HMO | Admitting: Vascular Surgery

## 2017-01-21 ENCOUNTER — Encounter (HOSPITAL_COMMUNITY): Payer: Medicare HMO

## 2017-02-10 DIAGNOSIS — M79672 Pain in left foot: Secondary | ICD-10-CM | POA: Diagnosis not present

## 2017-02-10 DIAGNOSIS — M25579 Pain in unspecified ankle and joints of unspecified foot: Secondary | ICD-10-CM | POA: Diagnosis not present

## 2017-03-07 DIAGNOSIS — M11232 Other chondrocalcinosis, left wrist: Secondary | ICD-10-CM | POA: Diagnosis not present

## 2017-03-07 DIAGNOSIS — M1711 Unilateral primary osteoarthritis, right knee: Secondary | ICD-10-CM | POA: Diagnosis not present

## 2017-03-17 DIAGNOSIS — M1712 Unilateral primary osteoarthritis, left knee: Secondary | ICD-10-CM | POA: Diagnosis not present

## 2017-04-09 DIAGNOSIS — M1990 Unspecified osteoarthritis, unspecified site: Secondary | ICD-10-CM | POA: Diagnosis not present

## 2017-04-09 DIAGNOSIS — R5382 Chronic fatigue, unspecified: Secondary | ICD-10-CM | POA: Diagnosis not present

## 2017-04-09 DIAGNOSIS — J0100 Acute maxillary sinusitis, unspecified: Secondary | ICD-10-CM | POA: Diagnosis not present

## 2017-05-06 DIAGNOSIS — Z8249 Family history of ischemic heart disease and other diseases of the circulatory system: Secondary | ICD-10-CM | POA: Diagnosis not present

## 2017-05-06 DIAGNOSIS — R69 Illness, unspecified: Secondary | ICD-10-CM | POA: Diagnosis not present

## 2017-05-06 DIAGNOSIS — G8929 Other chronic pain: Secondary | ICD-10-CM | POA: Diagnosis not present

## 2017-05-06 DIAGNOSIS — Z79891 Long term (current) use of opiate analgesic: Secondary | ICD-10-CM | POA: Diagnosis not present

## 2017-05-06 DIAGNOSIS — M1990 Unspecified osteoarthritis, unspecified site: Secondary | ICD-10-CM | POA: Diagnosis not present

## 2017-05-06 DIAGNOSIS — Z8673 Personal history of transient ischemic attack (TIA), and cerebral infarction without residual deficits: Secondary | ICD-10-CM | POA: Diagnosis not present

## 2017-05-06 DIAGNOSIS — I739 Peripheral vascular disease, unspecified: Secondary | ICD-10-CM | POA: Diagnosis not present

## 2017-05-20 DIAGNOSIS — J329 Chronic sinusitis, unspecified: Secondary | ICD-10-CM | POA: Insufficient documentation

## 2017-05-20 DIAGNOSIS — J343 Hypertrophy of nasal turbinates: Secondary | ICD-10-CM | POA: Diagnosis not present

## 2017-05-20 DIAGNOSIS — R05 Cough: Secondary | ICD-10-CM | POA: Diagnosis not present

## 2017-05-20 DIAGNOSIS — R053 Chronic cough: Secondary | ICD-10-CM | POA: Insufficient documentation

## 2017-05-20 DIAGNOSIS — H6983 Other specified disorders of Eustachian tube, bilateral: Secondary | ICD-10-CM | POA: Insufficient documentation

## 2017-05-22 DIAGNOSIS — S86012A Strain of left Achilles tendon, initial encounter: Secondary | ICD-10-CM | POA: Diagnosis not present

## 2017-06-05 DIAGNOSIS — M2141 Flat foot [pes planus] (acquired), right foot: Secondary | ICD-10-CM | POA: Diagnosis not present

## 2017-06-05 DIAGNOSIS — M2142 Flat foot [pes planus] (acquired), left foot: Secondary | ICD-10-CM | POA: Diagnosis not present

## 2017-06-05 DIAGNOSIS — S86012D Strain of left Achilles tendon, subsequent encounter: Secondary | ICD-10-CM | POA: Diagnosis not present

## 2017-11-27 ENCOUNTER — Other Ambulatory Visit: Payer: Self-pay | Admitting: Family Medicine

## 2017-11-27 ENCOUNTER — Ambulatory Visit
Admission: RE | Admit: 2017-11-27 | Discharge: 2017-11-27 | Disposition: A | Discharge status: Home / Self Care | Payer: Medicare HMO | Source: Ambulatory Visit | Attending: Family Medicine | Admitting: Family Medicine

## 2017-11-27 DIAGNOSIS — R0602 Shortness of breath: Secondary | ICD-10-CM

## 2017-11-27 DIAGNOSIS — R05 Cough: Secondary | ICD-10-CM | POA: Diagnosis not present

## 2017-11-27 DIAGNOSIS — J449 Chronic obstructive pulmonary disease, unspecified: Secondary | ICD-10-CM | POA: Diagnosis not present

## 2017-11-27 DIAGNOSIS — R059 Cough, unspecified: Secondary | ICD-10-CM

## 2017-11-27 DIAGNOSIS — R69 Illness, unspecified: Secondary | ICD-10-CM | POA: Diagnosis not present

## 2017-11-27 DIAGNOSIS — J189 Pneumonia, unspecified organism: Secondary | ICD-10-CM | POA: Diagnosis not present

## 2017-12-01 DIAGNOSIS — J0100 Acute maxillary sinusitis, unspecified: Secondary | ICD-10-CM | POA: Diagnosis not present

## 2017-12-01 DIAGNOSIS — Z1211 Encounter for screening for malignant neoplasm of colon: Secondary | ICD-10-CM | POA: Diagnosis not present

## 2017-12-07 DIAGNOSIS — Z1211 Encounter for screening for malignant neoplasm of colon: Secondary | ICD-10-CM | POA: Diagnosis not present

## 2017-12-12 DIAGNOSIS — J342 Deviated nasal septum: Secondary | ICD-10-CM | POA: Insufficient documentation

## 2017-12-12 DIAGNOSIS — J343 Hypertrophy of nasal turbinates: Secondary | ICD-10-CM | POA: Diagnosis not present

## 2017-12-12 DIAGNOSIS — J329 Chronic sinusitis, unspecified: Secondary | ICD-10-CM | POA: Diagnosis not present

## 2017-12-15 ENCOUNTER — Other Ambulatory Visit: Payer: Self-pay | Admitting: Family Medicine

## 2017-12-15 DIAGNOSIS — Z1231 Encounter for screening mammogram for malignant neoplasm of breast: Secondary | ICD-10-CM

## 2017-12-29 DIAGNOSIS — I5022 Chronic systolic (congestive) heart failure: Secondary | ICD-10-CM | POA: Diagnosis not present

## 2017-12-29 DIAGNOSIS — Z23 Encounter for immunization: Secondary | ICD-10-CM | POA: Diagnosis not present

## 2017-12-29 DIAGNOSIS — E785 Hyperlipidemia, unspecified: Secondary | ICD-10-CM | POA: Diagnosis not present

## 2017-12-29 DIAGNOSIS — Z5181 Encounter for therapeutic drug level monitoring: Secondary | ICD-10-CM | POA: Diagnosis not present

## 2017-12-29 DIAGNOSIS — J449 Chronic obstructive pulmonary disease, unspecified: Secondary | ICD-10-CM | POA: Diagnosis not present

## 2017-12-29 DIAGNOSIS — I6523 Occlusion and stenosis of bilateral carotid arteries: Secondary | ICD-10-CM | POA: Diagnosis not present

## 2017-12-29 DIAGNOSIS — Z0000 Encounter for general adult medical examination without abnormal findings: Secondary | ICD-10-CM | POA: Diagnosis not present

## 2017-12-29 DIAGNOSIS — I6529 Occlusion and stenosis of unspecified carotid artery: Secondary | ICD-10-CM | POA: Diagnosis not present

## 2017-12-30 ENCOUNTER — Ambulatory Visit
Admission: RE | Admit: 2017-12-30 | Discharge: 2017-12-30 | Disposition: A | Discharge status: Home / Self Care | Payer: Medicare HMO | Source: Ambulatory Visit | Attending: Family Medicine | Admitting: Family Medicine

## 2017-12-30 DIAGNOSIS — Z1231 Encounter for screening mammogram for malignant neoplasm of breast: Secondary | ICD-10-CM

## 2017-12-31 ENCOUNTER — Other Ambulatory Visit: Payer: Self-pay | Admitting: Family Medicine

## 2017-12-31 DIAGNOSIS — E2839 Other primary ovarian failure: Secondary | ICD-10-CM

## 2018-01-12 DIAGNOSIS — J343 Hypertrophy of nasal turbinates: Secondary | ICD-10-CM | POA: Diagnosis not present

## 2018-01-12 DIAGNOSIS — J329 Chronic sinusitis, unspecified: Secondary | ICD-10-CM | POA: Diagnosis not present

## 2018-01-12 DIAGNOSIS — J342 Deviated nasal septum: Secondary | ICD-10-CM | POA: Diagnosis not present

## 2018-01-12 DIAGNOSIS — J310 Chronic rhinitis: Secondary | ICD-10-CM | POA: Diagnosis not present

## 2018-01-14 ENCOUNTER — Other Ambulatory Visit: Payer: Self-pay | Admitting: Otolaryngology

## 2018-01-22 DIAGNOSIS — M1711 Unilateral primary osteoarthritis, right knee: Secondary | ICD-10-CM | POA: Diagnosis not present

## 2018-02-09 DIAGNOSIS — M1712 Unilateral primary osteoarthritis, left knee: Secondary | ICD-10-CM | POA: Diagnosis not present

## 2018-02-09 DIAGNOSIS — M25572 Pain in left ankle and joints of left foot: Secondary | ICD-10-CM | POA: Diagnosis not present

## 2018-02-09 NOTE — Pre-Procedure Instructions (Signed)
GENELL THEDE  02/09/2018      [facility] - [place], [place] - [id number] [place] [address] Phone: [phone] Fax: [phone]    Your procedure is scheduled on Wednesday November 27th.  Report to [facility] Admitting at [phone] A.M.  Call this number if you have problems the morning of surgery:  [phone]   Remember:  Do not eat or drink after midnight.    Take these medicines the morning of surgery with A SIP OF WATER   cetirizine (ZYRTEC) if needed  fluticasone (FLONASE)    7 days prior to surgery STOP taking any Aspirin(unless otherwise instructed by your surgeon), Aleve, Naproxen, Ibuprofen, Motrin, Advil, Goody's, BC's, all herbal medications, fish oil, and all vitamins     Do not wear jewelry, make-up or nail polish.  Do not wear lotions, powders, or perfumes, or deodorant.  Do not shave 48 hours prior to surgery.  Men may shave face and neck.  Do not bring valuables to the hospital.  [facility] is not responsible for any belongings or valuables.  Contacts, dentures or bridgework may not be worn into surgery.  Leave your suitcase in the car.  After surgery it may be brought to your room.  For patients admitted to the hospital, discharge time will be determined by your treatment team.  Patients discharged the day of surgery will not be allowed to drive home.    - Preparing For Surgery  Before surgery, you can play an important role. Because skin is not sterile, your skin needs to be as free of germs as possible. You can reduce the number of germs on your skin by washing with CHG (chlorahexidine gluconate) Soap before surgery.  CHG is an antiseptic cleaner which kills germs and bonds with the skin to continue killing germs even after washing.    Oral Hygiene is also important to reduce your risk of infection.  Remember - BRUSH YOUR TEETH THE MORNING OF SURGERY WITH YOUR REGULAR  TOOTHPASTE  Please do not use if you have an allergy to CHG or antibacterial soaps. If your skin becomes reddened/irritated stop using the CHG.  Do not shave (including legs and underarms) for at least 48 hours prior to first CHG shower. It is OK to shave your face.  Please follow these instructions carefully.   1. Shower the NIGHT BEFORE SURGERY and the MORNING OF SURGERY with CHG.   2. If you chose to wash your hair, wash your hair first as usual with your normal shampoo.  3. After you shampoo, rinse your hair and body thoroughly to remove the shampoo.  4. Use CHG as you would any other liquid soap. You can apply CHG directly to the skin and wash gently with a scrungie or a clean washcloth.   5. Apply the CHG Soap to your body ONLY FROM THE NECK DOWN.  Do not use on open wounds or open sores. Avoid contact with your eyes, ears, mouth and genitals (private parts). Wash Face and genitals (private parts)  with your normal soap.  6. Wash thoroughly, paying special attention to the area where your surgery will be performed.  7. Thoroughly rinse your body with warm water from the neck down.  8. DO NOT shower/wash with your normal soap after using and rinsing off the CHG Soap.  9. Pat yourself dry with a CLEAN TOWEL.  10. Wear CLEAN PAJAMAS to bed the night before  surgery, wear comfortable clothes the morning of surgery  11. Place CLEAN SHEETS on your bed the night of your first shower and DO NOT SLEEP WITH PETS.    Day of Surgery:  Do not apply any deodorants/lotions.  Please wear clean clothes to the hospital/surgery center.   Remember to brush your teeth WITH YOUR REGULAR TOOTHPASTE.    Please read over the following fact sheets that you were given.

## 2018-02-10 ENCOUNTER — Other Ambulatory Visit: Payer: Self-pay

## 2018-02-10 ENCOUNTER — Encounter (HOSPITAL_COMMUNITY): Payer: Self-pay

## 2018-02-10 ENCOUNTER — Encounter (HOSPITAL_COMMUNITY)
Admission: RE | Admit: 2018-02-10 | Discharge: 2018-02-10 | Disposition: A | Discharge status: Home / Self Care | Payer: Medicare HMO | Source: Ambulatory Visit | Attending: Otolaryngology | Admitting: Otolaryngology

## 2018-02-10 DIAGNOSIS — Z01818 Encounter for other preprocedural examination: Secondary | ICD-10-CM

## 2018-02-10 DIAGNOSIS — I6523 Occlusion and stenosis of bilateral carotid arteries: Secondary | ICD-10-CM | POA: Diagnosis not present

## 2018-02-10 HISTORY — DX: Heart failure, unspecified: I50.9

## 2018-02-10 HISTORY — DX: Left bundle-branch block, unspecified: I44.7

## 2018-02-10 LAB — BASIC METABOLIC PANEL
Anion gap: 11 (ref 5–15)
BUN: 14 mg/dL (ref 8–23)
CHLORIDE: 101 mmol/L (ref 98–111)
CO2: 25 mmol/L (ref 22–32)
Calcium: 9 mg/dL (ref 8.9–10.3)
Creatinine, Ser: 0.99 mg/dL (ref 0.44–1.00)
GFR calc Af Amer: >60 mL/min (ref >60)
GFR, EST NON AFRICAN AMERICAN: 57 mL/min — AB (ref >60)
GLUCOSE: 129 mg/dL — AB (ref 70–99)
POTASSIUM: 3.5 mmol/L (ref 3.5–5.1)
Sodium: 137 mmol/L (ref 135–145)

## 2018-02-10 LAB — HEMOGLOBIN: Hemoglobin: 11.5 g/dL — ABNORMAL LOW (ref 12.0–15.0)

## 2018-02-10 NOTE — Progress Notes (Signed)
PCP - Maurice Small MD Cardiologist -  [name] (pt doesn't see anymore)  Chest x-ray - 11/27/17 EKG - 02/10/18 Stress Test - 2014 ECHO - 2018 Cardiac Cath - 2018  Blood Thinner Instructions: N/A  Aspirin Instructions: N/A   Anesthesia review: Yes. Cardiac history  Patient denies shortness of breath, fever, cough and chest pain at PAT appointment   Patient verbalized understanding of instructions that were given to them at the PAT appointment. Patient was also instructed that they will need to review over the PAT instructions again at home before surgery.

## 2018-02-11 ENCOUNTER — Telehealth: Payer: Self-pay | Admitting: Cardiovascular Disease

## 2018-02-11 ENCOUNTER — Encounter (HOSPITAL_COMMUNITY): Payer: Self-pay | Admitting: Vascular Surgery

## 2018-02-11 ENCOUNTER — Encounter (HOSPITAL_COMMUNITY): Payer: Self-pay

## 2018-02-11 DIAGNOSIS — R931 Abnormal findings on diagnostic imaging of heart and coronary circulation: Secondary | ICD-10-CM

## 2018-02-11 DIAGNOSIS — I2510 Atherosclerotic heart disease of native coronary artery without angina pectoris: Secondary | ICD-10-CM

## 2018-02-11 DIAGNOSIS — Z01818 Encounter for other preprocedural examination: Secondary | ICD-10-CM

## 2018-02-11 NOTE — Telephone Encounter (Signed)
New Message   Mandy from ENT with Dr.Shoemaker is calling, wants to know if the pt needs a cardiac clearance or can they just be cleared with Dr. Donnetta Hutching at Vascular

## 2018-02-11 NOTE — Progress Notes (Addendum)
Anesthesia Chart Review:  Case:  [id number] Date/Time:  02/18/18 0830   Procedures:      NASAL SEPTOPLASTY WITH TURBINATE REDUCTION (Bilateral )     ENDOSCOPIC CONCHA BULLOSA RESECTION (Left )   Anesthesia type:  General   Pre-op diagnosis:  Deviated nasal septum, nasal turbinate hypertrophy   Location:  [facility] OR ROOM 09 / [place] [facility]   Surgeon:  Jerrell Belfast, MD      DISCUSSION: Patient is a 67 year old female scheduled for the above procedure.  History includes smoking, COPD, CHF (EF 30% with anterior/inferior wall motion abnormalities 06/17/16; 55% mLAD with EF > 55% by 06/25/16 cath), carotid artery disease (60-79% RICA stenosis 06/2016), left BBB GERD.   Last cardiology and vascular surgery encounter seen are from 06/2016. It appears patient cancelled her 12/2016 appointments. She denied SOB, fever, cough, and chest pain per PAT RN interview.  Reviewed above with anesthesiologist Oren Bracket, MD. Preoperative cardiology input recommended. Also discussed with Dr. Donnetta Hutching with recommendation for vascular surgery follow-up with repeat carotid U/S prior to elective surgery. I have notified Mandy at Dr. Victorio Palm office. She will notify patient. I also spoke with patient and her spouse. [facility] staff to contact patient to schedule appointment (they were able to work her in 02/12/18).      Chart will be left for follow-up.  ADDENDUM 02/17/18 3:53 PM: Recent cardiology and vascular surgery notes/reports reviewed. Patient was seen by Nickel, Vinnie Level, NP on 02/12/09 and had a follow-up carotid U/S showed stable carotid disease, and six month follow-up recommended. [name] recommended preoperative stress test. Patient did verbalize want to switch cardiologists to Kirk Ruths, MD (both he and [name] okay with this), but in the interim she did agree to the stress test which was done 02/17/18 and showed no ischemia, but again EF was low (25%). [name] would not clear patient for surgery until she  had an echocardiogram to confirm EF and if still low then proceed with cardiac cath. I have notified Dr. Wilburn Cornelia.    VS: BP (!) 110/59   Pulse (!) 103 Comment: taken manually/notified [place] (!) 36.4 C   Resp 18   Ht 5\' 2"  (1.575 m)   Wt 53.5 kg   SpO2 97%   BMI 21.58 kg/m    PROVIDERS: Maurice Small, MD is PCP - [name], MD is cardiologist. He first saw patient on 06/21/16 after referral by PCP for evaluation CHF and carotid artery disease after echo showed EF of 30% and carotid Duplex showed > 70 % RICA stenosis. It was felt she likely had non-ischemic dilated cardiomyopathy but with CAD risk factors and possible upcoming carotid surgery a cardiac cath was recommended. She was started on Coreg and Cozaar, but these were discontinued after her cardiac cath showed 55% LAD with normal LVEF. She was cleared for carotid endarterectomy, but CTA suggested only 60-79% (instead of 80-99%), so surgery deferred. [name] had recommended 3 month follow-up. Curt Jews, MD is vascular surgeon. Last visit 07/16/16 with six month follow-up recommended for surveillance of asymptomatic right ICA stenosis.    LABS: Labs reviewed: Acceptable for surgery. (all labs ordered are listed, but only abnormal results are displayed)  Labs Reviewed  HEMOGLOBIN - Abnormal; Notable for the following components:      Result Value   Hemoglobin 11.5 (*)    All other components within normal limits  BASIC METABOLIC PANEL - Abnormal; Notable for the following components:   Glucose,  Bld 129 (*)    GFR calc non Af Amer 57 (*)    All other components within normal limits     IMAGES: CT paranasal sinuses 01/12/18 ([place]): IMPRESSION: 1. Hyperplastic and relatively well aerated paranasal sinuses. Patent sinus drainage pathways, note variant maxillary sinus anatomy. 2. Nasal septal deviation and spurring. Somewhat atrophic appearing nasal cavity mucosa.  CXR  11/27/17: IMPRESSION: 1. Hyperaeration.  No active lung disease. 2. Stable borderline cardiomegaly.  Carotid 02/12/18: Summary: Right Carotid: Velocities in the right ICA are consistent with a 60-79%        stenosis, upper end of range. Left Carotid: Velocities in the left ICA are consistent with a 40-59% stenosis.  CTA neck 07/04/16: IMPRESSION: 1. Soft and calcified Right carotid bifurcation and Right ICA bulb atherosclerosis with stenosis numerically estimated at 60% at the RICA origin and 70% at the distal RICA bulb level. 2. Mild cervical Left carotid atherosclerosis without significant stenosis. Left ICA siphon is patent with more visible calcified plaque than on the right side. 3. Difficult to exclude hemodynamically significant proximal Left Subclavian Artery stenosis due to obscuration by innominate vein contrast streak artifact. 4. Dominant Left Vertebral Artery with calcified plaque at its origin but only mild stenosis. Mild to moderate origin stenosis of the non dominant Right Vertebral Artery which functionally terminates in PICA. 5.  Calcified aortic atherosclerosis. 6. Emphysema. (By 06/05/16 Carotid U/S: Right internal carotid artery velocities consistent with 80-99%, however, end diastolic velocities suggest 60-79%. Velocities suggest 40-59% left internal carotid artery stenosis; calcific plaque may obscure higher velocity.)   EKG: 02/10/18: NSR, biatrial enlargement, LAD, left BBB. Left BBB was also present on 06/11/16 EKG from [facility].   CV: Nuclear stress test 02/17/18:  The left ventricular ejection fraction is severely decreased (<30%).  Nuclear stress EF: 25%.  Defect 1: There is a medium defect of moderate severity present in the basal anteroseptal, mid anteroseptal and apex location.  This is a high risk study. Abnormal, high risk stress nuclear study with fixed anteroseptal and apical defect likely related to LBBB; no ischemia; EF 25  with global hypokinesis (worse in septum); severe LVE; study interpreted as high risk due to reduced LV function.  (Per [name], "No ischemia but EF down check echo to confirm what EF is no surgery until we know this If EF normal can have surgery if down will need repeat heart cath.")   RHC/LHC 06/25/16 (Bensimhon, Daniel, MD):  Mid LAD lesion, 55% stenosed.  The left ventricular ejection fraction is 55-65% by visual estimate. Findings: RA = 2 RV = 29/5 PA = 25/9 (16) PCW = 13 Ao 149/59 (93) LV 136/7 Fick cardiac output/index =4.7/2.9 PVR = 0.6 WU Ao sat = 99% PA sat = 74%, 76% Assessment: 1. 1v CAD with 50-60% lesion in mid LAD at bifurcation of large diagonal 2. EF 55% 3. Normal RH pressures and hemodynamics Plan/Discussion: LAD lesion appears non-obstructive. Reviewed with Dr. Martinique who agrees.  Echo 06/17/16: Study Conclusions - Left ventricle: The cavity size was normal. Wall thickness was   increased in a pattern of mild LVH. Anteroseptal and inferoseptal   severe hypokinesis. Inferior severe hypokinesis to akinesis.   Indeterminant diastolic function. The estimated ejection fraction   was 30%. - Aortic valve: There was no stenosis. - Mitral valve: There was mild regurgitation. - Right ventricle: The cavity size was normal. Systolic function   was normal. - Tricuspid valve: Peak RV-RA gradient (S): 24 mm Hg. - Pulmonary  arteries: PA peak pressure: 27 mm Hg (S). - Inferior vena cava: The vessel was normal in size. The   respirophasic diameter changes were in the normal range (>= 50%),   consistent with normal central venous pressure. Impressions: - Normal LV size with mild LV hypertrophy. EF 30% with wall motion   abnormalities as noted above. Normal RV size and systolic   function. Mild mitral regurgitation.   Past Medical History:  Diagnosis Date  . Allergy    seasonal allergies  . Arthritis    Bilateral Knees  . Carotid artery occlusion   . Carpal  tunnel syndrome   . CHF (congestive heart failure) ([place])    was "misdiagnosed" and after a heart cath was told there was nothing wrong.; EF 30% with anterior/inferior WMAs 06/17/16; EF 55-65% with 55% mLAD by 06/25/16 cath)  . COPD (chronic obstructive pulmonary disease) ([place])   . GERD (gastroesophageal reflux disease)   . Left bundle branch block   . Wears dentures   . Wears glasses     Past Surgical History:  Procedure Laterality Date  . ABDOMINAL HYSTERECTOMY  1974   still has ovaries  . BACK SURGERY  2016   lower back discectomy  . CARPAL TUNNEL RELEASE  08/15/2011   Procedure: CARPAL TUNNEL RELEASE;  Surgeon: Cammie Sickle., MD;  Location: [facility];  Service: Orthopedics;  Laterality: Right;  . CATARACT EXTRACTION W/PHACO Right 10/04/2013   Procedure: CATARACT EXTRACTION PHACO AND INTRAOCULAR LENS PLACEMENT (IOC);  Surgeon: Tonny Branch, MD;  Location: [facility];  Service: Ophthalmology;  Laterality: Right;  CDE 12.10  . CATARACT EXTRACTION W/PHACO Left 10/28/2013   Procedure: CATARACT EXTRACTION PHACO AND INTRAOCULAR LENS PLACEMENT (IOC);  Surgeon: Tonny Branch, MD;  Location: [facility];  Service: Ophthalmology;  Laterality: Left;  CDE: 9.10  . COLONOSCOPY  2006   [place], repeat 2012  . CYST EXCISION     vaginal X3  . EXCISION MORTON'S NEUROMA  11/14/2011   Procedure: EXCISION MORTON'S NEUROMA;  Surgeon: Marcheta Grammes, DPM;  Location: [facility];  Service: Orthopedics;  Laterality: Right;  Excision of Neuroma 2nd Interspace Right Foot  . FOOT NEUROMA SURGERY  2008   right  . HERNIA REPAIR  2000   right inguinal  . KNEE SURGERY  2012   left, cartilage repair  . KNEE SURGERY  2005   left knee, cartilage repair  . KNEE SURGERY  2012   left, cartilage repair  . RIGHT/LEFT HEART CATH AND CORONARY ANGIOGRAPHY N/A 06/25/2016   Procedure: Right/Left Heart Cath and Coronary Angiography;  Surgeon: Jolaine Artist, MD;  Location: [facility];  Service:  Cardiovascular;  Laterality: N/A;    MEDICATIONS: . cetirizine (ZYRTEC) 10 MG tablet  . cyclobenzaprine (FLEXERIL) 5 MG tablet  . diphenhydrAMINE (BENADRYL) 25 mg capsule  . fluticasone (FLONASE) 50 MCG/ACT nasal spray  . Menthol, Topical Analgesic, (BLUE-EMU MAXIMUM STRENGTH EX)  . predniSONE (DELTASONE) 20 MG tablet  . predniSONE (DELTASONE) 50 MG tablet   No current facility-administered medications for this encounter.   She is not currently taking prednisone or Benadryl (was prescribed pre-cath in 2018).    George Hugh [facility] Short Stay Center/Anesthesiology Phone [phone] 02/11/2018 4:25 PM

## 2018-02-12 ENCOUNTER — Other Ambulatory Visit: Payer: Self-pay

## 2018-02-12 ENCOUNTER — Ambulatory Visit: Payer: Medicare HMO | Admitting: Family

## 2018-02-12 ENCOUNTER — Encounter: Payer: Self-pay | Admitting: Family

## 2018-02-12 ENCOUNTER — Ambulatory Visit (HOSPITAL_COMMUNITY)
Admission: RE | Admit: 2018-02-12 | Discharge: 2018-02-12 | Disposition: A | Discharge status: Home / Self Care | Payer: Medicare HMO | Source: Ambulatory Visit | Attending: Family | Admitting: Family

## 2018-02-12 VITALS — BP 129/75 | HR 85 | Temp 97.5°F | Resp 16 | Ht 62.0 in | Wt 119.0 lb

## 2018-02-12 DIAGNOSIS — R69 Illness, unspecified: Secondary | ICD-10-CM | POA: Diagnosis not present

## 2018-02-12 DIAGNOSIS — I6523 Occlusion and stenosis of bilateral carotid arteries: Secondary | ICD-10-CM | POA: Diagnosis not present

## 2018-02-12 DIAGNOSIS — F17200 Nicotine dependence, unspecified, uncomplicated: Secondary | ICD-10-CM | POA: Diagnosis not present

## 2018-02-12 NOTE — Progress Notes (Signed)
Chief Complaint: Follow up Extracranial Carotid Artery Stenosis   History of Present Illness  Taylor [name] is a 67 y.o. female whom Dr. Donnetta Hutching evaluated on 07-16-16 for discussion of recent CT angiogram.  He had seen her approximately 3 weeks prior. At that time she had a duplex showing possible severe stenosis. She did have systolic velocities suggesting greater than 80% stenosis but end diastolic velocity suggesting 60-79% stenosis. She did have calcification as well. She underwent a CT angiogram for further evaluation.   She no longer feels generalized fatigue, states she feels well.   She is scheduled for nasal septal surgery soon.   She receives injections in her knees for OA pain.   Dr. Donnetta Hutching last evaluated pt on 07-16-16. At that time CT angiogram suggested 60-70% stenosis in her right internal carotid artery at the bifurcation. Asymptomatic 60-70% right internal carotid artery stenosis. Dr. Donnetta Hutching discussed this at length with the patient and her family present. She was very upset that she is known to have extensive evaluation with possible cardiac failure stroke and no evidence of the cause for her fatigue and symptoms. Dr. Donnetta Hutching explained that he certainly understood her frustration but did not have an etiology for her symptoms. He again reviewed the symptoms of right carotid disease with her and she'll notify should she have any difficulty. Otherwise he recommended duplex in 6 months to rule out progression of her asymptomatic disease.  She denies any known history of stroke or TIA. Specifically she denies a history of amaurosis fugax or monocular blindness, unilateral facial drooping, hemiplegia, or receptive or expressive aphasia.    Diabetic: no Tobacco use: current smoker, 1/2 ppd x 41 years  Pt meds include: Statin : no ASA: no, indicates that ASA causes gastric irritation Other anticoagulants/antiplatelets: no   Past Medical History:  Diagnosis Date  . Allergy    seasonal allergies  . Arthritis    Bilateral Knees  . Carotid artery occlusion   . Carpal tunnel syndrome   . CHF (congestive heart failure) ([place])    was "misdiagnosed" and after a heart cath was told there was nothing wrong.; EF 30% with anterior/inferior WMAs 06/17/16; EF 55-65% with 55% mLAD by 06/25/16 cath)  . COPD (chronic obstructive pulmonary disease) ([place])   . GERD (gastroesophageal reflux disease)   . Left bundle branch block   . Wears dentures   . Wears glasses     Social History Social History   Tobacco Use  . Smoking status: Current Every Day Smoker    Packs/day: 0.50    Years: 41.00    Pack years: 20.50    Types: Cigarettes  . Smokeless tobacco: Never Used  Substance Use Topics  . Alcohol use: Yes    Comment: rare  . Drug use: No    Family History Family History  Problem Relation Age of Onset  . Aneurysm Mother   . Heart disease Father 61       died of MI  . Heart attack Father   . Cancer Brother        unknown  . Liver disease Brother        1 brother died of cirrhosis    Surgical History Past Surgical History:  Procedure Laterality Date  . ABDOMINAL HYSTERECTOMY  1974   still has ovaries  . BACK SURGERY  2016   lower back discectomy  . CARPAL TUNNEL RELEASE  08/15/2011   Procedure: CARPAL TUNNEL RELEASE;  Surgeon: Cammie Sickle.,  MD;  Location: [facility];  Service: Orthopedics;  Laterality: Right;  . CATARACT EXTRACTION W/PHACO Right 10/04/2013   Procedure: CATARACT EXTRACTION PHACO AND INTRAOCULAR LENS PLACEMENT (IOC);  Surgeon: Tonny Branch, MD;  Location: [facility];  Service: Ophthalmology;  Laterality: Right;  CDE 12.10  . CATARACT EXTRACTION W/PHACO Left 10/28/2013   Procedure: CATARACT EXTRACTION PHACO AND INTRAOCULAR LENS PLACEMENT (IOC);  Surgeon: Tonny Branch, MD;  Location: [facility];  Service: Ophthalmology;  Laterality: Left;  CDE: 9.10  . COLONOSCOPY  2006   [place], repeat 2012  . CYST EXCISION     vaginal X3  . EXCISION  MORTON'S NEUROMA  11/14/2011   Procedure: EXCISION MORTON'S NEUROMA;  Surgeon: Marcheta Grammes, DPM;  Location: [facility];  Service: Orthopedics;  Laterality: Right;  Excision of Neuroma 2nd Interspace Right Foot  . FOOT NEUROMA SURGERY  2008   right  . HERNIA REPAIR  2000   right inguinal  . KNEE SURGERY  2012   left, cartilage repair  . KNEE SURGERY  2005   left knee, cartilage repair  . KNEE SURGERY  2012   left, cartilage repair  . RIGHT/LEFT HEART CATH AND CORONARY ANGIOGRAPHY N/A 06/25/2016   Procedure: Right/Left Heart Cath and Coronary Angiography;  Surgeon: Jolaine Artist, MD;  Location: [facility];  Service: Cardiovascular;  Laterality: N/A;    Allergies  Allergen Reactions  . Contrast Media [Iodinated Diagnostic Agents] Nausea And Vomiting    Mental fog, confusion   . Hydrocodone Nausea And Vomiting  . Tetracyclines & Related Nausea And Vomiting  . Gabapentin     Hallucinations     Current Outpatient Medications  Medication Sig Dispense Refill  . cetirizine (ZYRTEC) 10 MG tablet Take 10 mg by mouth daily as needed for allergies.     . cyclobenzaprine (FLEXERIL) 5 MG tablet Take 5 mg by mouth at bedtime.    . fluticasone (FLONASE) 50 MCG/ACT nasal spray Place 2 sprays into both nostrils daily.    . diphenhydrAMINE (BENADRYL) 25 mg capsule Take 50 mg (2 capsules) by mouth 1 hour prior to your procedure. (Patient not taking: Reported on 07/16/2016) 2 capsule 0  . Menthol, Topical Analgesic, (BLUE-EMU MAXIMUM STRENGTH EX) Apply 1 application topically daily as needed (pain).    . predniSONE (DELTASONE) 20 MG tablet Take 60 mg (3 tabs) by mouth the night before procedure and 60 mg (3 tabs) morning of procedure. (Patient not taking: Reported on 07/16/2016) 6 tablet 0  . predniSONE (DELTASONE) 50 MG tablet One tablet (50mg ) 13 hours prior to procedure; one tablet (50mg ) 7 hours prior to procedure and then one tablet (50 mg) one hour prior to procedure. (Patient not  taking: Reported on 07/16/2016) 3 tablet 0   No current facility-administered medications for this visit.     Review of Systems : See HPI for pertinent positives and negatives.  Physical Examination  Vitals:   02/12/18 1256 02/12/18 1258  BP: 127/77 129/75  Pulse: 85   Resp: 16   Temp: (!) 97.5 F (36.4 C)   SpO2: 98%   Weight: 119 lb (54 kg)   Height: 5\' 2"  (1.575 m)    Body mass index is 21.77 kg/m.  General: WDWN petite female in NAD GAIT: normal Eyes: PERRLA HENT: No gross abnormalities.  Pulmonary:  Respirations are non-labored, fair air movement in all fields, CTAB, no rales, rhonchi, or wheezing. Cardiac: regular rhythm, no detected murmur.  VASCULAR EXAM Carotid Bruits Right Left  Positive Positive     Abdominal aortic pulse is not palpable. Radial pulses are 2+ palpable and equal.                                                                                                                            LE Pulses Right Left       POPLITEAL  not palpable   not palpable       POSTERIOR TIBIAL   palpable    palpable        DORSALIS PEDIS      ANTERIOR TIBIAL  palpable  not palpable     Gastrointestinal: soft, nontender, BS WNL, no r/g, no palpable masses. Musculoskeletal: no muscle atrophy/wasting. M/S 5/5 throughout, extremities without ischemic changes. Skin: No rashes, no ulcers, no cellulitis.   Neurologic:  A&O X 3; appropriate affect, sensation is normal; speech is normal, CN 2-12 intact, pain and light touch intact in extremities, motor exam as listed above. Psychiatric: Normal thought content, mood appropriate to clinical situation.    Assessment: Taylor [name] is a 67 y.o. female who has no history of stroke or TIA. She does have bilateral carotid bruits.  Her pedal pulses are palpable.   Fortunately she does not have DM; unfortunately she continues to smoke x 41 years.  Over 3 minutes was spent counseling patient re smoking cessation, and  patient was given several free resources re smoking cessation.   DATA Carotid Duplex (02-12-18): Right ICA: 60-79% stenosis. The bifurcation is in the mid to upper neck.  Left ICA: 40-59% stenosis Bilateral vertebral artery flow is antegrade.  Bilateral subclavian artery waveforms are normal.  No change compared to the exam on 06-05-16.     Plan: Follow-up in 6 months with Carotid Duplex scan.   I discussed in depth with the patient the nature of atherosclerosis, and emphasized the importance of maximal medical management including strict control of blood pressure, blood glucose, and lipid levels, obtaining regular exercise, and cessation of smoking.  The patient is aware that without maximal medical management the underlying atherosclerotic disease process will progress, limiting the benefit of any interventions. The patient was given information about stroke prevention and what symptoms should prompt the patient to seek immediate medical care. Thank you for allowing [place] to participate in this patient's care.  Clemon Chambers, RN, MSN, FNP-C [facility] Office: [phone]  Clinic Physician: Oneida Alar  02/12/18 1:02 PM

## 2018-02-12 NOTE — Patient Instructions (Addendum)
Steps to Quit Smoking Smoking tobacco can be bad for your health. It can also affect almost every organ in your body. Smoking puts you and people around you at risk for many serious long-lasting (chronic) diseases. Quitting smoking is hard, but it is one of the best things that you can do for your health. It is never too late to quit. What are the benefits of quitting smoking? When you quit smoking, you lower your risk for getting serious diseases and conditions. They can include:  Lung cancer or lung disease.  Heart disease.  Stroke.  Heart attack.  Not being able to have children (infertility).  Weak bones (osteoporosis) and broken bones (fractures).  If you have coughing, wheezing, and shortness of breath, those symptoms may get better when you quit. You may also get sick less often. If you are pregnant, quitting smoking can help to lower your chances of having a baby of low birth weight. What can I do to help me quit smoking? Talk with your doctor about what can help you quit smoking. Some things you can do (strategies) include:  Quitting smoking totally, instead of slowly cutting back how much you smoke over a period of time.  Going to in-person counseling. You are more likely to quit if you go to many counseling sessions.  Using resources and support systems, such as: ? Online chats with a counselor. ? Phone quitlines. ? Printed self-help materials. ? Support groups or group counseling. ? Text messaging programs. ? Mobile phone apps or applications.  Taking medicines. Some of these medicines may have nicotine in them. If you are pregnant or breastfeeding, do not take any medicines to quit smoking unless your doctor says it is okay. Talk with your doctor about counseling or other things that can help you.  Talk with your doctor about using more than one strategy at the same time, such as taking medicines while you are also going to in-person counseling. This can help make  quitting easier. What things can I do to make it easier to quit? Quitting smoking might feel very hard at first, but there is a lot that you can do to make it easier. Take these steps:  Talk to your family and friends. Ask them to support and encourage you.  Call phone quitlines, reach out to support groups, or work with a counselor.  Ask people who smoke to not smoke around you.  Avoid places that make you want (trigger) to smoke, such as: ? Bars. ? Parties. ? Smoke-break areas at work.  Spend time with people who do not smoke.  Lower the stress in your life. Stress can make you want to smoke. Try these things to help your stress: ? Getting regular exercise. ? Deep-breathing exercises. ? Yoga. ? Meditating. ? Doing a body scan. To do this, close your eyes, focus on one area of your body at a time from head to toe, and notice which parts of your body are tense. Try to relax the muscles in those areas.  Download or buy apps on your mobile phone or tablet that can help you stick to your quit plan. There are many free apps, such as QuitGuide from the CDC (Centers for Disease Control and Prevention). You can find more support from smokefree.gov and other websites.  This information is not intended to replace advice given to you by your health care provider. Make sure you discuss any questions you have with your health care provider. Document Released: 01/05/2009 Document   Revised: 11/07/2015 Document Reviewed: 07/26/2014 Elsevier Interactive Patient Education  2018 Elsevier Inc.     Stroke Prevention Some health problems and behaviors may make it more likely for you to have a stroke. Below are ways to lessen your risk of having a stroke.  Be active for at least 30 minutes on most or all days.  Do not smoke. Try not to be around others who smoke.  Do not drink too much alcohol. ? Do not have more than 2 drinks a day if you are a man. ? Do not have more than 1 drink a day if you  are a woman and are not pregnant.  Eat healthy foods, such as fruits and vegetables. If you were put on a specific diet, follow the diet as told.  Keep your cholesterol levels under control through diet and medicines. Look for foods that are low in saturated fat, trans fat, cholesterol, and are high in fiber.  If you have diabetes, follow all diet plans and take your medicine as told.  Ask your doctor if you need treatment to lower your blood pressure. If you have high blood pressure (hypertension), follow all diet plans and take your medicine as told by your doctor.  If you are 18-39 years old, have your blood pressure checked every 3-5 years. If you are age 40 or older, have your blood pressure checked every year.  Keep a healthy weight. Eat foods that are low in calories, salt, saturated fat, trans fat, and cholesterol.  Do not take drugs.  Avoid birth control pills, if this applies. Talk to your doctor about the risks of taking birth control pills.  Talk to your doctor if you have sleep problems (sleep apnea).  Take all medicine as told by your doctor. ? You may be told to take aspirin or blood thinner medicine. Take this medicine as told by your doctor. ? Understand your medicine instructions.  Make sure any other conditions you have are being taken care of.  Get help right away if:  You suddenly lose feeling (you feel numb) or have weakness in your face, arm, or leg.  Your face or eyelid hangs down to one side.  You suddenly feel confused.  You have trouble talking (aphasia) or understanding what people are saying.  You suddenly have trouble seeing in one or both eyes.  You suddenly have trouble walking.  You are dizzy.  You lose your balance or your movements are clumsy (uncoordinated).  You suddenly have a very bad headache and you do not know the cause.  You have new chest pain.  Your heart feels like it is fluttering or skipping a beat (irregular  heartbeat). Do not wait to see if the symptoms above go away. Get help right away. Call your local emergency services (911 in [place] Do not drive yourself to the hospital. This information is not intended to replace advice given to you by your health care provider. Make sure you discuss any questions you have with your health care provider. Document Released: 09/10/2011 Document Revised: 08/17/2015 Document Reviewed: 09/11/2012 Elsevier Interactive Patient Education  2018 Elsevier Inc.  

## 2018-02-12 NOTE — Telephone Encounter (Signed)
Left message for Mandy to call back.

## 2018-02-12 NOTE — Telephone Encounter (Signed)
Needs lexiscan myovue to clear for surgery Had moderate LAD disease by cath in 2018

## 2018-02-13 ENCOUNTER — Other Ambulatory Visit: Payer: Self-pay | Admitting: Otolaryngology

## 2018-02-13 NOTE — Telephone Encounter (Signed)
Patient called back. Patient made an appointment on Tuesday to get cleared for surgery on 02/18/18 with Ellen Henri PA. Informed patient that per policy for patient to be cleared by any other staff besides [name] that she would need to come in and be evaluated since she has not been seen within the last 6 months.

## 2018-02-13 NOTE — Telephone Encounter (Signed)
Taylor [name] from ENT stated patient wants to switch cardiologist and see Dr. Stanford Breed instead. Will forward message to both [name] and Dr. Stanford Breed to agree to a switch.  Left message for patient to call back, so this can be discussed with her.

## 2018-02-13 NOTE — Telephone Encounter (Signed)
Ok with me Taylor [name]  

## 2018-02-13 NOTE — Telephone Encounter (Signed)
Fine

## 2018-02-13 NOTE — Telephone Encounter (Signed)
Ellen Henri PA talked to patient about getting lexiscan myoview to get cleared for surgery. Patient agreed to get myoview done on Tuesday 02/17/18. Will send message to Reader B that day, Dr. Stanford Breed, to let him know test needs to be read ASAP and send to Ellen Henri PA, due to patient having surgery the next day 02/18/18.

## 2018-02-17 ENCOUNTER — Ambulatory Visit: Payer: Medicare HMO | Admitting: Cardiology

## 2018-02-17 ENCOUNTER — Telehealth: Payer: Self-pay | Admitting: Cardiovascular Disease

## 2018-02-17 ENCOUNTER — Ambulatory Visit (HOSPITAL_COMMUNITY)
Admission: RE | Admit: 2018-02-17 | Discharge: 2018-02-17 | Disposition: A | Discharge status: Home / Self Care | Payer: Medicare HMO | Source: Ambulatory Visit | Attending: Internal Medicine | Admitting: Internal Medicine

## 2018-02-17 DIAGNOSIS — Z01818 Encounter for other preprocedural examination: Secondary | ICD-10-CM | POA: Diagnosis not present

## 2018-02-17 DIAGNOSIS — I2510 Atherosclerotic heart disease of native coronary artery without angina pectoris: Secondary | ICD-10-CM | POA: Diagnosis not present

## 2018-02-17 LAB — MYOCARDIAL PERFUSION IMAGING
CHL CUP NUCLEAR SSS: 6
CHL CUP RESTING HR STRESS: 88 {beats}/min
LVSYSVOL: 119 mL
Peak HR: 117 {beats}/min
SDS: 1
SRS: 5
TID: 1.11

## 2018-02-17 MED ORDER — TECHNETIUM TC 99M TETROFOSMIN IV KIT
10.8000 | PACK | Freq: Once | INTRAVENOUS | Status: AC | PRN
  Administered 2018-02-17: 10.8 via INTRAVENOUS
  Filled 2018-02-17: qty 11

## 2018-02-17 MED ORDER — REGADENOSON 0.4 MG/5ML IV SOLN
0.4000 mg | Freq: Once | INTRAVENOUS | Status: AC
  Administered 2018-02-17: 0.4 mg via INTRAVENOUS

## 2018-02-17 MED ORDER — AMINOPHYLLINE 25 MG/ML IV SOLN
75.0000 mg | Freq: Once | INTRAVENOUS | Status: AC
  Administered 2018-02-17: 75 mg via INTRAVENOUS

## 2018-02-17 MED ORDER — TECHNETIUM TC 99M TETROFOSMIN IV KIT
30.1000 | PACK | Freq: Once | INTRAVENOUS | Status: AC | PRN
  Administered 2018-02-17: 30.1 via INTRAVENOUS
  Filled 2018-02-17: qty 31

## 2018-02-17 NOTE — Telephone Encounter (Signed)
Called Mandy at Dr. Victorio Palm office back. Informed her of patient's test results. They have cancelled her surgery for tomorrow. Informed her that patient's next steps with our office would be to follow up with Dr. Stanford Breed and have echo. Mandy verbalized understanding.

## 2018-02-17 NOTE — OR Nursing (Signed)
Received communication from [facility] of need to cancel procedure for Taylor [name]. Per note from Myra Gianotti on 02/17/2018 @ 3:53 pm patient needs cardiac clearance and she notified Dr. Wilburn Cornelia of need to cancel procedure.

## 2018-02-17 NOTE — Telephone Encounter (Signed)
Called and left detailed message with [facility] Dr. Victorio Palm nurse. Informed them on the message that patient would still need an echo before being cleared for surgery. Called patient with stress test results. Per [name], No ischemia but EF down check echo to confirm what EF is no surgery until we know this If EF normal can have surgery if down will need repeat heart cath.  First available echo is tomorrow at 7:30 am. Patient is scheduled for surgery in the am and suppose to be at the hospital at 6:00 am. Patient stated if she could not have echo before her scheduled surgery that she did not need one. Patient stated the next available surgery date is 03/11/18. Informed patient she could go ahead and schedule echo even if her surgery gets pushed out. Patient refused to schedule echo at this time. Informed patient to call her surgeons office so they are aware. Informed patient to call our office if she changes her mind about getting echo scheduled. Sent results of stress test to Dr. Wilburn Cornelia, so he is aware.

## 2018-02-17 NOTE — Telephone Encounter (Signed)
Mandy @ Dr Victorio Palm office was calling back

## 2018-02-18 ENCOUNTER — Encounter (HOSPITAL_COMMUNITY): Admission: RE | Payer: Self-pay | Source: Ambulatory Visit

## 2018-02-18 ENCOUNTER — Ambulatory Visit (HOSPITAL_COMMUNITY): Admission: RE | Admit: 2018-02-18 | Payer: Medicare HMO | Source: Ambulatory Visit | Admitting: Otolaryngology

## 2018-02-18 SURGERY — SEPTOPLASTY, NOSE, WITH NASAL TURBINATE REDUCTION
Anesthesia: General | Laterality: Left

## 2018-02-23 NOTE — Telephone Encounter (Signed)
Pt called stating she is to make an appt with Dr. Stanford Breed for surg clearance. I offered an 830am tomorrow with Truitt Merle, she refused saying she only is to schedule with [facility]. I see message now that pt needs an Echo prior to sur clearance, is this still the case?  Pls call [phone]

## 2018-02-24 NOTE — Telephone Encounter (Signed)
Scheduled patient for echo at [place] with Dr. Purcell Nails at [facility] for 02/25/18.

## 2018-02-24 NOTE — Telephone Encounter (Signed)
Follow up    Patient is calling to see about getting the recommended echocardiogram scheduled as well as to further discuss. Please call.

## 2018-02-24 NOTE — Progress Notes (Addendum)
Cardiology Office Note   Date:  02/25/2018   ID:  Zyia, Kaneko 1951/01/03, MRN [id number]  PCP:  Maurice Small, MD  Cardiologist: Johnsie [name]  Chief Complaint  Patient presents with  . Coronary Artery Disease  . PAD     History of Present Illness: Taylor [name] is a 68 y.o. female who presents for ongoing assessment and management of CHF, with known history of PAD with high end  RICA stenosis of 70%. Other history includes COPD, ongoing tobacco abuse.   She is here for pre-operative cardiology evaluation. She had Lexiscan Myoview on 02/17/2018 which revealed severely decreased LV function of 25%, but no ischemia. Echocardiogram was completed today revealing an EF of 15%. Taylor [name] was notified and she was to keep her appointment today.    Cardiac Cath 06/25/2016 Assessment: 1. 1v CAD with 50-60% lesion in mid LAD at bifurcation of large diagonal 2. EF 55% 3. Normal RH pressures and hemodynamics   She denies symptoms of chest pain,dypena, or fatigue. She is frustrated by all of Taylor delays in her progress to have surgery. She has been given multiple opinions on her heart function.   Past Medical History:  Diagnosis Date  . Allergy    seasonal allergies  . Arthritis    Bilateral Knees  . Carotid artery occlusion   . Carpal tunnel syndrome   . CHF (congestive heart failure) ([place])    was "misdiagnosed" and after a heart cath was told there was nothing wrong.; EF 30% with anterior/inferior WMAs 06/17/16; EF 55-65% with 55% mLAD by 06/25/16 cath)  . COPD (chronic obstructive pulmonary disease) ([place])   . GERD (gastroesophageal reflux disease)   . Left bundle branch block   . Wears dentures   . Wears glasses     Past Surgical History:  Procedure Laterality Date  . ABDOMINAL HYSTERECTOMY  1974   still has ovaries  . BACK SURGERY  2016   lower back discectomy  . CARPAL TUNNEL RELEASE  08/15/2011   Procedure: CARPAL TUNNEL RELEASE;  Surgeon: Cammie Sickle., MD;  Location:  [place];  Service: Orthopedics;  Laterality: Right;  . CATARACT EXTRACTION W/PHACO Right 10/04/2013   Procedure: CATARACT EXTRACTION PHACO AND INTRAOCULAR LENS PLACEMENT (IOC);  Surgeon: Tonny Branch, MD;  Location: [facility];  Service: Ophthalmology;  Laterality: Right;  CDE 12.10  . CATARACT EXTRACTION W/PHACO Left 10/28/2013   Procedure: CATARACT EXTRACTION PHACO AND INTRAOCULAR LENS PLACEMENT (IOC);  Surgeon: Tonny Branch, MD;  Location: [facility];  Service: Ophthalmology;  Laterality: Left;  CDE: 9.10  . COLONOSCOPY  2006   [place], repeat 2012  . CYST EXCISION     vaginal X3  . EXCISION MORTON'S NEUROMA  11/14/2011   Procedure: EXCISION MORTON'S NEUROMA;  Surgeon: Marcheta Grammes, DPM;  Location: [facility];  Service: Orthopedics;  Laterality: Right;  Excision of Neuroma 2nd Interspace Right Foot  . FOOT NEUROMA SURGERY  2008   right  . HERNIA REPAIR  2000   right inguinal  . KNEE SURGERY  2012   left, cartilage repair  . KNEE SURGERY  2005   left knee, cartilage repair  . KNEE SURGERY  2012   left, cartilage repair  . RIGHT/LEFT HEART CATH AND CORONARY ANGIOGRAPHY N/A 06/25/2016   Procedure: Right/Left Heart Cath and Coronary Angiography;  Surgeon: Jolaine Artist, MD;  Location: [place] [facility];  Service: Cardiovascular;  Laterality: N/A;     Current Outpatient Medications  Medication Sig  Dispense Refill  . cetirizine (ZYRTEC) 10 MG tablet Take 10 mg by mouth daily as needed for allergies.     . cyclobenzaprine (FLEXERIL) 5 MG tablet Take 5 mg by mouth at bedtime.    . fluticasone (FLONASE) 50 MCG/ACT nasal spray Place 2 sprays into both nostrils daily.    Marland Kitchen aspirin EC 81 MG tablet Take 1 tablet (81 mg total) by mouth daily. 90 tablet 3  . bisoprolol (ZEBETA) 5 MG tablet Take 0.5 tablets (2.5 mg total) by mouth daily. 15 tablet 11   No current facility-administered medications for this visit.     Allergies:   Contrast media [iodinated diagnostic agents];  Gabapentin; Hydrocodone; and Tetracyclines & related    Social History:  Taylor patient  reports that she has been smoking cigarettes. She has a 20.50 pack-year smoking history. She has never used smokeless tobacco. She reports that she drinks alcohol. She reports that she does not use drugs.   Family History:  Taylor patient's family history includes Aneurysm in her mother; Cancer in her brother; Heart attack in her father; Heart disease (age of onset: 42) in her father; Liver disease in her brother.    ROS: All other systems are reviewed and negative. Unless otherwise mentioned in H&P    PHYSICAL EXAM: VS:  BP 122/60   Pulse 100   Ht 5' 2.5" (1.588 m)   Wt 119 lb 6.4 oz (54.2 kg)   BMI 21.49 kg/m  , BMI Body mass index is 21.49 kg/m. GEN: Well nourished, well developed, in no acute distress HEENT: normal Neck: no JVD, carotid bruits, or masses Cardiac: RRR, tachycardic; no murmurs, rubs, or gallops,no edema  Respiratory:  Clear to auscultation bilaterally, normal work of breathing GI: soft, nontender, nondistended, + BS MS: no deformity or atrophy Skin: warm and dry, no rash Neuro:  Strength and sensation are intact Psych: euthymic mood, full affect   EKG:  NSR with LBBB pattern and biatrial enlargement. Rate of 100 bpm.   Recent Labs: 02/10/2018: BUN 14; Creatinine, Ser 0.99; Hemoglobin 11.5; Potassium 3.5; Sodium 137    Lipid Panel    Component Value Date/Time   CHOL 194 08/27/2010 1017   TRIG 66 08/27/2010 1017   HDL 53 08/27/2010 1017   CHOLHDL 3.7 08/27/2010 1017   VLDL 13 08/27/2010 1017   LDLCALC 128 (H) 08/27/2010 1017      Wt Readings from Last 3 Encounters:  02/25/18 119 lb 6.4 oz (54.2 kg)  02/17/18 119 lb (54 kg)  02/12/18 119 lb (54 kg)      Other studies Reviewed: 02/12/2018 Carotid Duplex Study  Right Carotid: Velocities in Taylor right ICA are consistent with a 60-79%        stenosis, upper end of range. Left Carotid: Velocities in Taylor left  ICA are consistent with a 40-59% stenosis.  Echocardiogram 02/25/2018 Procedure narrative: Transthoracic echocardiography. Image   quality was poor. Intravenous contrast (Definity) was   administered to opacify Taylor LV. - Left ventricle: Taylor cavity size was normal. Systolic function was   severely reduced. Taylor estimated ejection fraction was in Taylor   range of 25% to 30%. Diffuse hypokinesis with akinesis of Taylor   basal and mid inferior, inferoseptal, anteroseptal, anterior and   apical septal and anterior walls. Doppler parameters are   consistent with restrictive physiology, indicative of decreased   left ventricular diastolic compliance and/or increased left   atrial pressure. Doppler parameters are consistent with elevated   ventricular end-diastolic filling  pressure. - Ventricular septum: Septal motion showed paradox. - Aortic valve: There was no regurgitation. - Mitral valve: There was mild regurgitation. - Right ventricle: Taylor cavity size was normal. Wall thickness was   normal. Systolic function was normal. - Right atrium: Taylor atrium was normal in size. - Tricuspid valve: There was mild regurgitation. - Pulmonic valve: There was no regurgitation. - Pulmonary arteries: Systolic pressure was mildly increased. PA   peak pressure: 32 mm Hg (S). - Inferior vena cava: Taylor vessel was normal in size. - Pericardium, extracardiac: There was no pericardial effusion.   ASSESSMENT AND PLAN:  1.  Systolic Dysfunction: I have asked Taylor [name] to read Taylor echo as it was not read at Taylor time of Taylor patient's scheduled appointment. She felt that Taylor EF was reduced more than Taylor prior study. She graciously spoke with Taylor patient and her [name] in Taylor reading room and showed them Taylor echo images along with Taylor cardiac cath film from prior cardiac cath She answered multiple questions. It is Taylor [name] opinion that Taylor patient should proceed with cardiac cath.   I have spoken at length  with Taylor patient and her [name] concerning cardiac cath.Taylor patient understands that risks include but are not limited to stroke (1 in 1000), death (1 in 36), kidney failure [usually temporary] (1 in 500), bleeding (1 in 200), allergic reaction [possibly serious] (1 in 200), and agrees to proceed.   She is to have cath on Friday, February 27, 2018 with Taylor [name].   In Taylor interim due to reduced EF I will begin her on bisoprolol 2.5 mg , as she has a history of COPD and did not tolerate non-selective BB, carvedilol without having significant breathing and chest pressure issues. She is advised that this medication may cause some fatigue.  She is asked to take ASA 81 mg daily. I will not begin ARB or ACE until I have labs to evaluate her kidney function.   2. CAD: Most recent cardiac cath 06/2016 demonstrated 55% stenosis of Taylor LAD at at Taylor bifurcation. She has not been on any cardiac medications for over a year.   3. Carotid Artery Disease: I have explained Taylor latest carotid study to Taylor patient and her [name]. She has right greater than left disease. She will follow up with Taylor [name].   4. COPD: She is not on any pulmonary medications at this time. She will need to follow up with PCP for referral.   5. Preoperative Cardiac Evaluaition: Due to abnormal echocardiogram and known CAD, she is a high risk to proceed with surgery at this time and is not cleared from cardiac standpoint to proceed.   Current medicines are reviewed at length with Taylor patient today.  Over an hour was spent with this patient and her [name], along with consultation with Taylor [name] who also spent time with Taylor patient. They request to be changed to Taylor [name] for cardiology management.   Labs/ tests ordered today include: Cardiac cath and pre cath labs.   Addendum: Taylor patient will be given IV Dye prophylaxis per cath lab protocol Rx had been sent by Taylor Senegal RN via telephone okay.    Taylor [name],  ANP, AACC   02/25/2018 4:17 PM    [facility] [place] [address] Office [phone] Fax [phone]

## 2018-02-24 NOTE — H&P (View-Only) (Signed)
Cardiology Office Note   Date:  02/25/2018   ID:  Taylor [name], Taylor [name] 04-04-1950, MRN [id number]  PCP:  Maurice Small, MD  Cardiologist: Johnsie Cancel  Chief Complaint  Patient presents with  . Coronary Artery Disease  . PAD     History of Present Illness: Taylor [name] is a 67 y.o. female who presents for ongoing assessment and management of CHF, with known history of PAD with high end  RICA stenosis of 70%. Other history includes COPD, ongoing tobacco abuse.   She is here for pre-operative cardiology evaluation. She had Lexiscan Myoview on 02/17/2018 which revealed severely decreased LV function of 25%, but no ischemia. Echocardiogram was completed today revealing an EF of 15%. [name] was notified and she was to keep her appointment today.    Cardiac Cath 06/25/2016 Assessment: 1. 1v CAD with 50-60% lesion in mid LAD at bifurcation of large diagonal 2. EF 55% 3. Normal RH pressures and hemodynamics   She denies symptoms of chest pain,dypena, or fatigue. She is frustrated by all of the delays in her progress to have surgery. She has been given multiple opinions on her heart function.   Past Medical History:  Diagnosis Date  . Allergy    seasonal allergies  . Arthritis    Bilateral Knees  . Carotid artery occlusion   . Carpal tunnel syndrome   . CHF (congestive heart failure) ([place])    was "misdiagnosed" and after a heart cath was told there was nothing wrong.; EF 30% with anterior/inferior WMAs 06/17/16; EF 55-65% with 55% mLAD by 06/25/16 cath)  . COPD (chronic obstructive pulmonary disease) ([place])   . GERD (gastroesophageal reflux disease)   . Left bundle branch block   . Wears dentures   . Wears glasses     Past Surgical History:  Procedure Laterality Date  . ABDOMINAL HYSTERECTOMY  1974   still has ovaries  . BACK SURGERY  2016   lower back discectomy  . CARPAL TUNNEL RELEASE  08/15/2011   Procedure: CARPAL TUNNEL RELEASE;  Surgeon: Cammie Sickle., MD;  Location:  [place];  Service: Orthopedics;  Laterality: Right;  . CATARACT EXTRACTION W/PHACO Right 10/04/2013   Procedure: CATARACT EXTRACTION PHACO AND INTRAOCULAR LENS PLACEMENT (IOC);  Surgeon: Tonny Branch, MD;  [facility];  Service: Ophthalmology;  Laterality: Right;  CDE 12.10  . CATARACT EXTRACTION W/PHACO Left 10/28/2013   Procedure: CATARACT EXTRACTION PHACO AND INTRAOCULAR LENS PLACEMENT (IOC);  Surgeon: Tonny Branch, MD;  Location: [facility];  Service: Ophthalmology;  Laterality: Left;  CDE: 9.10  . COLONOSCOPY  2006   [place], repeat 2012  . CYST EXCISION     vaginal X3  . EXCISION MORTON'S NEUROMA  11/14/2011   Procedure: EXCISION MORTON'S NEUROMA;  Surgeon: Marcheta Grammes, DPM;  Location[facility];  Service: Orthopedics;  Laterality: Right;  Excision of Neuroma 2nd Interspace Right Foot  . FOOT NEUROMA SURGERY  2008   right  . HERNIA REPAIR  2000   right inguinal  . KNEE SURGERY  2012   left, cartilage repair  . KNEE SURGERY  2005   left knee, cartilage repair  . KNEE SURGERY  2012   left, cartilage repair  . RIGHT/LEFT HEART CATH AND CORONARY ANGIOGRAPHY N/A 06/25/2016   Procedure: Right/Left Heart Cath and Coronary Angiography;  Surgeon: Jolaine Artist, MD;  Location: [place] [facility];  Service: Cardiovascular;  Laterality: N/A;     Current Outpatient Medications  Medication Sig  Dispense Refill  . cetirizine (ZYRTEC) 10 MG tablet Take 10 mg by mouth daily as needed for allergies.     . cyclobenzaprine (FLEXERIL) 5 MG tablet Take 5 mg by mouth at bedtime.    . fluticasone (FLONASE) 50 MCG/ACT nasal spray Place 2 sprays into both nostrils daily.    Marland Kitchen aspirin EC 81 MG tablet Take 1 tablet (81 mg total) by mouth daily. 90 tablet 3  . bisoprolol (ZEBETA) 5 MG tablet Take 0.5 tablets (2.5 mg total) by mouth daily. 15 tablet 11   No current facility-administered medications for this visit.     Allergies:   Contrast media [iodinated diagnostic agents];  Gabapentin; Hydrocodone; and Tetracyclines & related    Social History:  The patient  reports that she has been smoking cigarettes. She has a 20.50 pack-year smoking history. She has never used smokeless tobacco. She reports that she drinks alcohol. She reports that she does not use drugs.   Family History:  The patient's family history includes Aneurysm in her mother; Cancer in her brother; Heart attack in her father; Heart disease (age of onset: 84) in her father; Liver disease in her brother.    ROS: All other systems are reviewed and negative. Unless otherwise mentioned in H&P    PHYSICAL EXAM: VS:  BP 122/60   Pulse 100   Ht 5' 2.5" (1.588 m)   Wt 119 lb 6.4 oz (54.2 kg)   BMI 21.49 kg/m  , BMI Body mass index is 21.49 kg/m. GEN: Well nourished, well developed, in no acute distress HEENT: normal Neck: no JVD, carotid bruits, or masses Cardiac: RRR, tachycardic; no murmurs, rubs, or gallops,no edema  Respiratory:  Clear to auscultation bilaterally, normal work of breathing GI: soft, nontender, nondistended, + BS MS: no deformity or atrophy Skin: warm and dry, no rash Neuro:  Strength and sensation are intact Psych: euthymic mood, full affect   EKG:  NSR with LBBB pattern and biatrial enlargement. Rate of 100 bpm.   Recent Labs: 02/10/2018: BUN 14; Creatinine, Ser 0.99; Hemoglobin 11.5; Potassium 3.5; Sodium 137    Lipid Panel    Component Value Date/Time   CHOL 194 08/27/2010 1017   TRIG 66 08/27/2010 1017   HDL 53 08/27/2010 1017   CHOLHDL 3.7 08/27/2010 1017   VLDL 13 08/27/2010 1017   LDLCALC 128 (H) 08/27/2010 1017      Wt Readings from Last 3 Encounters:  02/25/18 119 lb 6.4 oz (54.2 kg)  02/17/18 119 lb (54 kg)  02/12/18 119 lb (54 kg)      Other studies Reviewed: 02/12/2018 Carotid Duplex Study  Right Carotid: Velocities in the right ICA are consistent with a 60-79%        stenosis, upper end of range. Left Carotid: Velocities in the left  ICA are consistent with a 40-59% stenosis.  Echocardiogram 02/25/2018 Procedure narrative: Transthoracic echocardiography. Image   quality was poor. Intravenous contrast (Definity) was   administered to opacify the LV. - Left ventricle: The cavity size was normal. Systolic function was   severely reduced. The estimated ejection fraction was in the   range of 25% to 30%. Diffuse hypokinesis with akinesis of the   basal and mid inferior, inferoseptal, anteroseptal, anterior and   apical septal and anterior walls. Doppler parameters are   consistent with restrictive physiology, indicative of decreased   left ventricular diastolic compliance and/or increased left   atrial pressure. Doppler parameters are consistent with elevated   ventricular end-diastolic filling  pressure. - Ventricular septum: Septal motion showed paradox. - Aortic valve: There was no regurgitation. - Mitral valve: There was mild regurgitation. - Right ventricle: The cavity size was normal. Wall thickness was   normal. Systolic function was normal. - Right atrium: The atrium was normal in size. - Tricuspid valve: There was mild regurgitation. - Pulmonic valve: There was no regurgitation. - Pulmonary arteries: Systolic pressure was mildly increased. PA   peak pressure: 32 mm Hg (S). - Inferior vena cava: The vessel was normal in size. - Pericardium, extracardiac: There was no pericardial effusion.   ASSESSMENT AND PLAN:  1.  Systolic Dysfunction: I have asked Dr. Harrell Gave to read the echo as it was not read at the time of the patient's scheduled appointment. She felt that the EF was reduced more than the prior study. She graciously spoke with the patient and her husband in the reading room and showed them the echo images along with the cardiac cath film from prior cardiac cath She answered multiple questions. It is Dr. Judeth Cornfield opinion that the patient should proceed with cardiac cath.   I have spoken at length  with the patient and her husband concerning cardiac cath.The patient understands that risks include but are not limited to stroke (1 in 1000), death (1 in 33), kidney failure [usually temporary] (1 in 500), bleeding (1 in 200), allergic reaction [possibly serious] (1 in 200), and agrees to proceed.   She is to have cath on Friday, February 27, 2018 with Dr. Martinique.   In the interim due to reduced EF I will begin her on bisoprolol 2.5 mg , as she has a history of COPD and did not tolerate non-selective BB, carvedilol without having significant breathing and chest pressure issues. She is advised that this medication may cause some fatigue.  She is asked to take ASA 81 mg daily. I will not begin ARB or ACE until I have labs to evaluate her kidney function.   2. CAD: Most recent cardiac cath 06/2016 demonstrated 55% stenosis of the LAD at at the bifurcation. She has not been on any cardiac medications for over a year.   3. Carotid Artery Disease: I have explained the latest carotid study to the patient and her husband. She has right greater than left disease. She will follow up with Dr. Donnetta Hutching.   4. COPD: She is not on any pulmonary medications at this time. She will need to follow up with PCP for referral.   5. Preoperative Cardiac Evaluaition: Due to abnormal echocardiogram and known CAD, she is a high risk to proceed with surgery at this time and is not cleared from cardiac standpoint to proceed.   Current medicines are reviewed at length with the patient today.  Over an hour was spent with this patient and her husband, along with consultation with Dr. Harrell Gave who also spent time with the patient. They request to be changed to Dr. Stanford Breed for cardiology management.   Labs/ tests ordered today include: Cardiac cath and pre cath labs.   Addendum: The patient will be given IV Dye prophylaxis per cath lab protocol Rx had been sent by Albertina Senegal RN via telephone okay.    Phill Myron. West Pugh,  ANP, AACC   02/25/2018 4:17 PM    [facility] [place] [address] Office [phone] Fax [phone]

## 2018-02-25 ENCOUNTER — Other Ambulatory Visit: Payer: Self-pay

## 2018-02-25 ENCOUNTER — Ambulatory Visit (HOSPITAL_COMMUNITY): Payer: Medicare HMO | Attending: Cardiology

## 2018-02-25 ENCOUNTER — Encounter (HOSPITAL_COMMUNITY): Payer: Self-pay

## 2018-02-25 ENCOUNTER — Encounter: Payer: Self-pay | Admitting: Adult Health

## 2018-02-25 ENCOUNTER — Ambulatory Visit: Payer: Medicare HMO | Admitting: Adult Health

## 2018-02-25 VITALS — BP 122/60 | HR 100 | Ht 62.5 in | Wt 119.4 lb

## 2018-02-25 DIAGNOSIS — R9439 Abnormal result of other cardiovascular function study: Secondary | ICD-10-CM

## 2018-02-25 DIAGNOSIS — Z01818 Encounter for other preprocedural examination: Secondary | ICD-10-CM | POA: Diagnosis present

## 2018-02-25 DIAGNOSIS — I2510 Atherosclerotic heart disease of native coronary artery without angina pectoris: Secondary | ICD-10-CM

## 2018-02-25 DIAGNOSIS — R931 Abnormal findings on diagnostic imaging of heart and coronary circulation: Secondary | ICD-10-CM | POA: Diagnosis present

## 2018-02-25 DIAGNOSIS — Z79899 Other long term (current) drug therapy: Secondary | ICD-10-CM | POA: Diagnosis not present

## 2018-02-25 DIAGNOSIS — J449 Chronic obstructive pulmonary disease, unspecified: Secondary | ICD-10-CM | POA: Diagnosis not present

## 2018-02-25 DIAGNOSIS — Z01810 Encounter for preprocedural cardiovascular examination: Secondary | ICD-10-CM | POA: Diagnosis not present

## 2018-02-25 DIAGNOSIS — I519 Heart disease, unspecified: Secondary | ICD-10-CM | POA: Diagnosis not present

## 2018-02-25 MED ORDER — PERFLUTREN LIPID MICROSPHERE
1.0000 mL | INTRAVENOUS | Status: AC | PRN
  Administered 2018-02-25: 1 mL via INTRAVENOUS

## 2018-02-25 MED ORDER — BISOPROLOL FUMARATE 5 MG PO TABS: 2.5000 mg | ORAL_TABLET | Freq: Every day | ORAL | 11 refills | Status: DC

## 2018-02-25 MED ORDER — ASPIRIN EC 81 MG PO TBEC: 81.0000 mg | DELAYED_RELEASE_TABLET | Freq: Every day | ORAL | 3 refills | Status: DC

## 2018-02-25 NOTE — Patient Instructions (Addendum)
Medication Instructions:  START BISOPROLOL 2.5MG  DAILY (1/2 OF 5MG  TABLET)  START ASPIRIN 81MG -BABY ASPIRIN  Your physician recommends that you schedule a follow-up appointment in: 2 weeks WITH DR [place] IF PRIMARY CARDIOLOGIST.      [address] [place] [address] Dept: [phone] Loc: [phone]  TIENA MANANSALA  02/25/2018  You are scheduled for a Cardiac Catheterization on Friday, December 6 with Dr. Peter Martinique.  1. Please arrive at the [facility] (Main Entrance A) at [facility]: [address] [place], [place] [address] at 11:30 AM (This time is two hours before your procedure to ensure your preparation). Free valet parking service is available.   Special note: Every effort is made to have your procedure done on time. Please understand that emergencies sometimes delay scheduled procedures.  2. Diet: Do not eat solid foods after midnight.  The patient may have clear liquids until 5am upon the day of the procedure.  3. Labs: You will need to have blood drawn on Wednesday, December 4 at [place]  Open: 8am - 5pm (Lunch 12:30 - 1:30)   Phone: [phone]. You do not need to be fasting.  4. Medication instructions in preparation for your procedure:   Contrast Allergy: No  On the morning of your procedure, take your Aspirin and any morning medicines NOT listed above.  You may use sips of water.  5. Plan for one night stay--bring personal belongings. 6. Bring a current list of your medications and current insurance cards. 7. You MUST have a responsible person to drive you home. 8. Someone MUST be with you the first 24 hours after you arrive home or your discharge will be delayed. 9. Please wear clothes that are easy to get on and off and wear slip-on shoes.  Thank you for allowing [place] to  care for you!   -- [facility]

## 2018-02-25 NOTE — Progress Notes (Signed)
Mrs. Taylor [name] presented for echocardiogram for pre-op clearance. Her previous echo 3/18 showed an EF of 30%. Her echo today showed an EF of 10-20% with no symptoms to my knowledge. [name] was notified about the low EF and advised her to keep her appointment with [name] this afternoon and let her know about this echo.  Wyatt Mage, RDCS.

## 2018-02-26 ENCOUNTER — Telehealth: Payer: Self-pay | Admitting: *Deleted

## 2018-02-26 LAB — CBC
HEMOGLOBIN: 11.5 g/dL (ref 11.1–15.9)
Hematocrit: 34.3 % (ref 34.0–46.6)
MCH: 30.8 pg (ref 26.6–33.0)
MCHC: 33.5 g/dL (ref 31.5–35.7)
MCV: 92 fL (ref 79–97)
Platelets: 413 10*3/uL (ref 150–450)
RBC: 3.73 x10E6/uL — AB (ref 3.77–5.28)
RDW: 12.6 % (ref 12.3–15.4)
WBC: 8.5 10*3/uL (ref 3.4–10.8)

## 2018-02-26 LAB — BASIC METABOLIC PANEL
BUN/Creatinine Ratio: 10 — ABNORMAL LOW (ref 12–28)
BUN: 7 mg/dL — ABNORMAL LOW (ref 8–27)
CALCIUM: 8.7 mg/dL (ref 8.7–10.3)
CHLORIDE: 100 mmol/L (ref 96–106)
CO2: 23 mmol/L (ref 20–29)
Creatinine, Ser: 0.69 mg/dL (ref 0.57–1.00)
GFR calc Af Amer: 103 mL/min/{1.73_m2} (ref >59)
GFR, EST NON AFRICAN AMERICAN: 89 mL/min/{1.73_m2} (ref >59)
GLUCOSE: 85 mg/dL (ref 65–99)
POTASSIUM: 3.9 mmol/L (ref 3.5–5.2)
Sodium: 142 mmol/L (ref 134–144)

## 2018-02-26 LAB — PROTIME-INR
INR: 1 (ref 0.8–1.2)
PROTHROMBIN TIME: 10.9 s (ref 9.1–12.0)

## 2018-02-26 MED ORDER — PREDNISONE 50 MG PO TABS: ORAL_TABLET | ORAL | 0 refills | Status: DC

## 2018-02-26 NOTE — Telephone Encounter (Addendum)
Pt contacted pre-catheterization scheduled at [facility] for: Friday February 27, 2018 1:30 PM Verified arrival time and place: [place] Entrance A at: 11:30 AM  No solid food after midnight prior to cath, clear liquids until 5 AM day of procedure. Contrast allergy: yes-see notes-pt instructed 13 hour Prednisone and Benadryl Prep: Prednisone 50 mg 02/27/18 12:30 AM Prednisone 50 mg 02/27/18 6:30 AM Prednisone 50 mg and Benadryl 50 mg just prior to leaving for hospital AM of procedure.  AM meds can be  taken pre-cath with sip of water including: ASA 81 mg  Confirmed  patient has responsible person to drive home post procedure and for 24 hours after you arrive home: yes

## 2018-02-27 ENCOUNTER — Encounter (HOSPITAL_COMMUNITY)
Admission: RE | Disposition: A | Discharge status: Home / Self Care | Payer: Self-pay | Source: Home / Self Care | Attending: Cardiology

## 2018-02-27 ENCOUNTER — Encounter: Payer: Self-pay | Admitting: Adult Health

## 2018-02-27 ENCOUNTER — Encounter (HOSPITAL_COMMUNITY): Payer: Self-pay | Admitting: Cardiology

## 2018-02-27 ENCOUNTER — Ambulatory Visit (HOSPITAL_COMMUNITY)
Admission: RE | Admit: 2018-02-27 | Discharge: 2018-02-27 | Disposition: A | Discharge status: Home / Self Care | Payer: Medicare HMO | Attending: Cardiology | Admitting: Cardiology

## 2018-02-27 ENCOUNTER — Other Ambulatory Visit: Payer: Self-pay

## 2018-02-27 DIAGNOSIS — I509 Heart failure, unspecified: Secondary | ICD-10-CM | POA: Insufficient documentation

## 2018-02-27 DIAGNOSIS — K219 Gastro-esophageal reflux disease without esophagitis: Secondary | ICD-10-CM | POA: Diagnosis not present

## 2018-02-27 DIAGNOSIS — M1990 Unspecified osteoarthritis, unspecified site: Secondary | ICD-10-CM | POA: Insufficient documentation

## 2018-02-27 DIAGNOSIS — Z91041 Radiographic dye allergy status: Secondary | ICD-10-CM | POA: Insufficient documentation

## 2018-02-27 DIAGNOSIS — I2510 Atherosclerotic heart disease of native coronary artery without angina pectoris: Secondary | ICD-10-CM | POA: Diagnosis not present

## 2018-02-27 DIAGNOSIS — I420 Dilated cardiomyopathy: Secondary | ICD-10-CM | POA: Diagnosis present

## 2018-02-27 DIAGNOSIS — J449 Chronic obstructive pulmonary disease, unspecified: Secondary | ICD-10-CM | POA: Diagnosis not present

## 2018-02-27 DIAGNOSIS — Z8249 Family history of ischemic heart disease and other diseases of the circulatory system: Secondary | ICD-10-CM | POA: Diagnosis not present

## 2018-02-27 DIAGNOSIS — F17210 Nicotine dependence, cigarettes, uncomplicated: Secondary | ICD-10-CM | POA: Diagnosis not present

## 2018-02-27 DIAGNOSIS — Z885 Allergy status to narcotic agent status: Secondary | ICD-10-CM | POA: Insufficient documentation

## 2018-02-27 DIAGNOSIS — I447 Left bundle-branch block, unspecified: Secondary | ICD-10-CM | POA: Diagnosis not present

## 2018-02-27 DIAGNOSIS — Z7982 Long term (current) use of aspirin: Secondary | ICD-10-CM | POA: Insufficient documentation

## 2018-02-27 DIAGNOSIS — Z7951 Long term (current) use of inhaled steroids: Secondary | ICD-10-CM | POA: Insufficient documentation

## 2018-02-27 DIAGNOSIS — I519 Heart disease, unspecified: Secondary | ICD-10-CM

## 2018-02-27 DIAGNOSIS — R69 Illness, unspecified: Secondary | ICD-10-CM | POA: Diagnosis not present

## 2018-02-27 HISTORY — DX: Dilated cardiomyopathy: I42.0

## 2018-02-27 HISTORY — PX: LEFT HEART CATH AND CORONARY ANGIOGRAPHY: CATH118249

## 2018-02-27 HISTORY — PX: ULTRASOUND GUIDANCE FOR VASCULAR ACCESS: SHX6516

## 2018-02-27 SURGERY — LEFT HEART CATH AND CORONARY ANGIOGRAPHY
Anesthesia: LOCAL

## 2018-02-27 MED ORDER — VERAPAMIL HCL 2.5 MG/ML IV SOLN
INTRAVENOUS | Status: AC
  Filled 2018-02-27: qty 2

## 2018-02-27 MED ORDER — SODIUM CHLORIDE 0.9% FLUSH: 3.0000 mL | INTRAVENOUS | Status: DC | PRN

## 2018-02-27 MED ORDER — HEPARIN (PORCINE) IN NACL 1000-0.9 UT/500ML-% IV SOLN
INTRAVENOUS | Status: AC
  Filled 2018-02-27: qty 1000

## 2018-02-27 MED ORDER — ACETAMINOPHEN 325 MG PO TABS: 650.0000 mg | ORAL_TABLET | ORAL | Status: DC | PRN

## 2018-02-27 MED ORDER — MIDAZOLAM HCL 2 MG/2ML IJ SOLN
INTRAMUSCULAR | Status: DC | PRN
  Administered 2018-02-27: 1 mg via INTRAVENOUS

## 2018-02-27 MED ORDER — HEPARIN (PORCINE) IN NACL 1000-0.9 UT/500ML-% IV SOLN
INTRAVENOUS | Status: DC | PRN
  Administered 2018-02-27 (×2): 500 mL

## 2018-02-27 MED ORDER — MIDAZOLAM HCL 2 MG/2ML IJ SOLN
INTRAMUSCULAR | Status: AC
  Filled 2018-02-27: qty 2

## 2018-02-27 MED ORDER — LIDOCAINE HCL (PF) 1 % IJ SOLN
INTRAMUSCULAR | Status: AC
  Filled 2018-02-27: qty 30

## 2018-02-27 MED ORDER — SODIUM CHLORIDE 0.9 % IV SOLN: 250.0000 mL | INTRAVENOUS | Status: DC | PRN

## 2018-02-27 MED ORDER — SODIUM CHLORIDE 0.9 % IV SOLN
INTRAVENOUS | Status: DC
  Administered 2018-02-27: 09:00:00 via INTRAVENOUS

## 2018-02-27 MED ORDER — LIDOCAINE HCL (PF) 1 % IJ SOLN
INTRAMUSCULAR | Status: DC | PRN
  Administered 2018-02-27: 2 mL

## 2018-02-27 MED ORDER — FENTANYL CITRATE (PF) 100 MCG/2ML IJ SOLN
INTRAMUSCULAR | Status: AC
  Filled 2018-02-27: qty 2

## 2018-02-27 MED ORDER — HEPARIN SODIUM (PORCINE) 1000 UNIT/ML IJ SOLN
INTRAMUSCULAR | Status: DC | PRN
  Administered 2018-02-27: 3000 [IU] via INTRAVENOUS

## 2018-02-27 MED ORDER — IOHEXOL 350 MG/ML SOLN
INTRAVENOUS | Status: DC | PRN
  Administered 2018-02-27: 60 mL via INTRAVENOUS

## 2018-02-27 MED ORDER — SODIUM CHLORIDE 0.9% FLUSH: 3.0000 mL | Freq: Two times a day (BID) | INTRAVENOUS | Status: DC

## 2018-02-27 MED ORDER — VERAPAMIL HCL 2.5 MG/ML IV SOLN
INTRAVENOUS | Status: DC | PRN
  Administered 2018-02-27: 10 mL via INTRA_ARTERIAL

## 2018-02-27 MED ORDER — ONDANSETRON HCL 4 MG/2ML IJ SOLN: 4.0000 mg | Freq: Four times a day (QID) | INTRAMUSCULAR | Status: DC | PRN

## 2018-02-27 MED ORDER — SODIUM CHLORIDE 0.9 % WEIGHT BASED INFUSION: 1.0000 mL/kg/h | INTRAVENOUS | Status: AC

## 2018-02-27 MED ORDER — FENTANYL CITRATE (PF) 100 MCG/2ML IJ SOLN
INTRAMUSCULAR | Status: DC | PRN
  Administered 2018-02-27: 25 ug via INTRAVENOUS

## 2018-02-27 SURGICAL SUPPLY — 11 items
CATH 5FR JL3.5 JR4 ANG PIG MP (CATHETERS) ×1 IMPLANT
DEVICE RAD COMP TR BAND LRG (VASCULAR PRODUCTS) ×1 IMPLANT
GLIDESHEATH SLEND SS 6F .021 (SHEATH) ×1 IMPLANT
GUIDEWIRE INQWIRE 1.5J.035X260 (WIRE) IMPLANT
INQWIRE 1.5J .035X260CM (WIRE) ×3
KIT HEART LEFT (KITS) ×3 IMPLANT
PACK CARDIAC CATHETERIZATION (CUSTOM PROCEDURE TRAY) ×3 IMPLANT
SHEATH PROBE COVER 6X72 (BAG) ×1 IMPLANT
SYR MEDRAD MARK 7 150ML (SYRINGE) ×3 IMPLANT
TRANSDUCER W/STOPCOCK (MISCELLANEOUS) ×3 IMPLANT
TUBING CIL FLEX 10 FLL-RA (TUBING) ×3 IMPLANT

## 2018-02-27 NOTE — Discharge Instructions (Signed)
DRINK PLENTY OF FLUIDS FOR NEXT COUPLE DAYS Radial Site Care Refer to this sheet in the next few weeks. These instructions provide you with information about caring for yourself after your procedure. Your health care provider may also give you more specific instructions. Your treatment has been planned according to current medical practices, but problems sometimes occur. Call your health care provider if you have any problems or questions after your procedure. What can I expect after the procedure? After your procedure, it is typical to have the following:  Bruising at the radial site that usually fades within 1-2 weeks.  Blood collecting in the tissue (hematoma) that may be painful to the touch. It should usually decrease in size and tenderness within 1-2 weeks.  Follow these instructions at home:  Take medicines only as directed by your health care provider.  You may shower 24-48 hours after the procedure or as directed by your health care provider. Remove the bandage (dressing) and gently wash the site with plain soap and water. Pat the area dry with a clean towel. Do not rub the site, because this may cause bleeding.  Do not take baths, swim, or use a hot tub until your health care provider approves.  Check your insertion site every day for redness, swelling, or drainage.  Do not apply powder or lotion to the site.  Do not flex or bend the affected arm for 24 hours or as directed by your health care provider.  Do not push or pull heavy objects with the affected arm for 24 hours or as directed by your health care provider.  Do not lift over 10 lb (4.5 kg) for 5 days after your procedure or as directed by your health care provider.  Ask your health care provider when it is okay to: ? Return to work or school. ? Resume usual physical activities or sports. ? Resume sexual activity.  Do not drive home if you are discharged the same day as the procedure. Have someone else drive  you.  You may drive 24 hours after the procedure unless otherwise instructed by your health care provider.  Do not operate machinery or power tools for 24 hours after the procedure.  If your procedure was done as an outpatient procedure, which means that you went home the same day as your procedure, a responsible adult should be with you for the first 24 hours after you arrive home.  Keep all follow-up visits as directed by your health care provider. This is important. Contact a health care provider if:  You have a fever.  You have chills.  You have increased bleeding from the radial site. Hold pressure on the site. Get help right away if:  You have unusual pain at the radial site.  You have redness, warmth, or swelling at the radial site.  You have drainage (other than a small amount of blood on the dressing) from the radial site.  The radial site is bleeding, and the bleeding does not stop after 30 minutes of holding steady pressure on the site.  Your arm or hand becomes pale, cool, tingly, or numb. This information is not intended to replace advice given to you by your health care provider. Make sure you discuss any questions you have with your health care provider. Document Released: 04/13/2010 Document Revised: 08/17/2015 Document Reviewed: 09/27/2013 Elsevier Interactive Patient Education  2018 [employer].

## 2018-02-27 NOTE — Progress Notes (Signed)
Thank you. Will send a note to the surgeon.

## 2018-02-27 NOTE — Progress Notes (Signed)
1135 site stared to ooze, 3 cc air replaced.  Site u.

## 2018-02-27 NOTE — Interval H&P Note (Signed)
History and Physical Interval Note:  02/27/2018 9:46 AM  Taylor [name]  has presented today for surgery, with the diagnosis of abnormal stress test  The various methods of treatment have been discussed with the patient and family. After consideration of risks, benefits and other options for treatment, the patient has consented to  Procedure(s): LEFT HEART CATH AND CORONARY ANGIOGRAPHY (N/A) as a surgical intervention .  The patient's history has been reviewed, patient examined, no change in status, stable for surgery.  I have reviewed the patient's chart and labs.  Questions were answered to the patient's satisfaction.   Cath Lab Visit (complete for each Cath Lab visit)  Clinical Evaluation Leading to the Procedure:   ACS: No.  Non-ACS:    Anginal Classification: CCS I  Anti-ischemic medical therapy: Minimal Therapy (1 class of medications)  Non-Invasive Test Results: High-risk stress test findings: cardiac mortality >3%/year  Prior CABG: No previous CABG        Taylor [name] [facility] 02/27/2018 9:47 AM

## 2018-02-27 NOTE — Progress Notes (Unsigned)
   Primary Cardiologist: Kirk Ruths, MD  Chart reviewed as part of pre-operative protocol coverage. Given past medical history and time since last visit, based on ACC/AHA guidelines, Taylor [name] would be at acceptable risk for the planned procedure without further cardiovascular testing. Cardiac cath did not reveal any coronary artery disease. She has a reduced EF which cardiology will manage. Recommend that overhydration be avoided to prevent fluid overload.   I Please call with questions.  Phill Myron. West Pugh, ANP, AACC  02/27/2018, 4:24 PM

## 2018-03-02 ENCOUNTER — Encounter (HOSPITAL_COMMUNITY): Payer: Self-pay | Admitting: Cardiology

## 2018-03-03 ENCOUNTER — Telehealth: Payer: Self-pay | Admitting: Adult Health

## 2018-03-03 NOTE — Telephone Encounter (Signed)
She can hold the ASA prior to the surgery for 14 days. After the procedure she needs to stay on ECASA 81 mg daily as she has heart disease and heart failure.

## 2018-03-03 NOTE — Telephone Encounter (Signed)
Pt called to report that she had started ASA 81mg  on 02/26/18... She has consistently taken it since then... After her 12/6 cath, she was advised that she is cleared for her 12/18 surgery with Dr. Wilburn Cornelia... Dr. Wilburn Cornelia had advised her to hold it for 2 weeks prior but she has not.. so I highly advised her to let him know that... She is also asking if it is okay for her to be holding her ASA at all from a cardiac standpoint.. Will forward to [name] NP for her review.

## 2018-03-03 NOTE — Telephone Encounter (Signed)
  Patient is scheduled for surgery but [name] had instructed her to take a baby aspirin. Patients instruction from Desiree Lucy was to continue the baby aspirin but Dr Wilburn Cornelia said she needs to stop the aspirin. Patient would like clarification on which she needs to do.

## 2018-03-04 ENCOUNTER — Ambulatory Visit
Admission: RE | Admit: 2018-03-04 | Discharge: 2018-03-04 | Disposition: A | Discharge status: Home / Self Care | Payer: Medicare HMO | Source: Ambulatory Visit | Attending: Family Medicine | Admitting: Family Medicine

## 2018-03-04 DIAGNOSIS — M8589 Other specified disorders of bone density and structure, multiple sites: Secondary | ICD-10-CM | POA: Diagnosis not present

## 2018-03-04 DIAGNOSIS — Z780 Asymptomatic menopausal state: Secondary | ICD-10-CM | POA: Diagnosis not present

## 2018-03-04 DIAGNOSIS — E2839 Other primary ovarian failure: Secondary | ICD-10-CM

## 2018-03-04 NOTE — Telephone Encounter (Signed)
Gave pt the information from Taylor Sims NP re: her ASA and I asked her to please let her surgeon known that she has not been holding it so it will not be a full 2 weeks as he wanted. Pt verbalized understanding.

## 2018-03-06 ENCOUNTER — Telehealth: Payer: Self-pay | Admitting: Adult Health

## 2018-03-06 NOTE — Telephone Encounter (Signed)
New message   Pt c/o medication issue:  1. Name of Medication: bisoprolol (ZEBETA) 5 MG tablet  2. How are you currently taking this medication (dosage and times per day)? 1 time daily  3. Are you having a reaction (difficulty breathing--STAT)? No   4. What is your medication issue? Patient states that she feels that this medication may be causing her not to be able to bend or straighten her legs. Please advise.

## 2018-03-06 NOTE — Telephone Encounter (Signed)
Called patient, she states that she has noticed her joints are giving her issues. Since Saturday she has noticed it is worsening. She is unable to bend her left knee the most, but can also feel it effecting her ankles. She is wondering if a side effect of the new medication could attack her joints. She just started Bisoprolol on 12/5, and the only other medications she is on the [facility], she stopped her aspirin on Monday. Patient states it is painful, and throbbing, no redness noted, it is swollen.  Please advise if this is related to medication and any recommendations, thanks!

## 2018-03-06 NOTE — Telephone Encounter (Signed)
Called patient, gave message from PharmD. Patient verbalized understanding.

## 2018-03-06 NOTE — Telephone Encounter (Signed)
Joint pain is not a commons side effects for Bisoprolol but is possible in < 1% of the patients. Also noted patient has history of  joint pain and difficulty walking.   Recommendation:  1. Please verify patient is taking bisoprolol 2.5mg  and not 5mg , decrease dose may ease the symptoms. If patient taking 2.5mg  daily, please change to 2.5mg  every other day for now.  2. Continue to hold aspirin as instructed prior to her surgery.  3. Follow up with PCP/orthopedic doctor for pain and swelling management.

## 2018-03-06 NOTE — Pre-Procedure Instructions (Signed)
Taylor [name]  03/06/2018      [facility] - [place], [place] - [id number] [place] [address] Phone: [phone] Fax: [phone]    Your procedure is scheduled on December 18th.  Report to [facility] Admitting at 9:30 A.M.  Call this number if you have problems the morning of surgery:  [phone]   Remember:  Do not eat or drink after midnight.     Take these medicines the morning of surgery with A SIP OF WATER    Cyclobenzaprine (Flexeril)  Flonase - if needed  7 days prior to surgery STOP taking any Aspirin(unless otherwise instructed by your surgeon), Aleve, Naproxen, Ibuprofen, Motrin, Advil, Goody's, BC's, all herbal medications, fish oil, and all vitamins     Do not wear jewelry, make-up or nail polish.  Do not wear lotions, powders, or perfumes, or deodorant.  Do not shave 48 hours prior to surgery.  Men may shave face and neck.  Do not bring valuables to the hospital.  [facility] is not responsible for any belongings or valuables.   [place]- Preparing For Surgery  Before surgery, you can play an important role. Because skin is not sterile, your skin needs to be as free of germs as possible. You can reduce the number of germs on your skin by washing with CHG (chlorahexidine gluconate) Soap before surgery.  CHG is an antiseptic cleaner which kills germs and bonds with the skin to continue killing germs even after washing.    Oral Hygiene is also important to reduce your risk of infection.  Remember - BRUSH YOUR TEETH THE MORNING OF SURGERY WITH YOUR REGULAR TOOTHPASTE  Please do not use if you have an allergy to CHG or antibacterial soaps. If your skin becomes reddened/irritated stop using the CHG.  Do not shave (including legs and underarms) for at least 48 hours prior to first CHG shower. It is OK to shave your face.  Please follow these instructions carefully.   1. Shower the NIGHT BEFORE SURGERY  and the MORNING OF SURGERY with CHG.   2. If you chose to wash your hair, wash your hair first as usual with your normal shampoo.  3. After you shampoo, rinse your hair and body thoroughly to remove the shampoo.  4. Use CHG as you would any other liquid soap. You can apply CHG directly to the skin and wash gently with a scrungie or a clean washcloth.   5. Apply the CHG Soap to your body ONLY FROM THE NECK DOWN.  Do not use on open wounds or open sores. Avoid contact with your eyes, ears, mouth and genitals (private parts). Wash Face and genitals (private parts)  with your normal soap.  6. Wash thoroughly, paying special attention to the area where your surgery will be performed.  7. Thoroughly rinse your body with warm water from the neck down.  8. DO NOT shower/wash with your normal soap after using and rinsing off the CHG Soap.  9. Pat yourself dry with a CLEAN TOWEL.  10. Wear CLEAN PAJAMAS to bed the night before surgery, wear comfortable clothes the morning of surgery  11. Place CLEAN SHEETS on your bed the night of your first shower and DO NOT SLEEP WITH PETS.    Day of Surgery:  Do not apply any deodorants/lotions.  Please wear clean clothes to the hospital/surgery center.   Remember to brush your teeth WITH YOUR REGULAR TOOTHPASTE.  Contacts, dentures or bridgework may not be worn into surgery.  Leave your suitcase in the car.  After surgery it may be brought to your room.  For patients admitted to the hospital, discharge time will be determined by your treatment team.  Patients discharged the day of surgery will not be allowed to drive home.

## 2018-03-09 ENCOUNTER — Inpatient Hospital Stay (HOSPITAL_COMMUNITY)
Admission: RE | Admit: 2018-03-09 | Discharge: 2018-03-09 | Disposition: A | Discharge status: Home / Self Care | Payer: Medicare HMO | Source: Ambulatory Visit

## 2018-03-09 DIAGNOSIS — M84375A Stress fracture, left foot, initial encounter for fracture: Secondary | ICD-10-CM | POA: Diagnosis not present

## 2018-03-09 NOTE — Pre-Procedure Instructions (Signed)
KHAMORA KARAN  03/09/2018      [facility] - [place], [place] - [id number] [place] [address] Phone: [phone] Fax: [phone]    Your procedure is scheduled on December 18th.  Report to [facility] Admitting at 9:30 A.M.  Call this number if you have problems the morning of surgery:  [phone]   Remember:  Do not eat or drink after midnight.     Take these medicines the morning of surgery with A SIP OF WATER   bisoprolol (ZEBETA)   Cyclobenzaprine (Flexeril)  Flonase   7 days prior to surgery STOP taking any Aspirin(unless otherwise instructed by your surgeon), Aleve, Naproxen, Ibuprofen, Motrin, Advil, Goody's, BC's, all herbal medications, fish oil, and all vitamins  Follow your surgeon's instructions on when to stop Asprin.  If no instructions were given by your surgeon then you will need to call the office to get those instructions.     Do not wear jewelry, make-up or nail polish.  Do not wear lotions, powders, or perfumes, or deodorant.  Do not shave 48 hours prior to surgery.  Men may shave face and neck.  Do not bring valuables to the hospital.  [facility] is not responsible for any belongings or valuables.   [place]- Preparing For Surgery  Before surgery, you can play an important role. Because skin is not sterile, your skin needs to be as free of germs as possible. You can reduce the number of germs on your skin by washing with CHG (chlorahexidine gluconate) Soap before surgery.  CHG is an antiseptic cleaner which kills germs and bonds with the skin to continue killing germs even after washing.    Oral Hygiene is also important to reduce your risk of infection.  Remember - BRUSH YOUR TEETH THE MORNING OF SURGERY WITH YOUR REGULAR TOOTHPASTE  Please do not use if you have an allergy to CHG or antibacterial soaps. If your skin becomes reddened/irritated stop using the CHG.  Do not shave (including  legs and underarms) for at least 48 hours prior to first CHG shower. It is OK to shave your face.  Please follow these instructions carefully.   1. Shower the NIGHT BEFORE SURGERY and the MORNING OF SURGERY with CHG.   2. If you chose to wash your hair, wash your hair first as usual with your normal shampoo.  3. After you shampoo, rinse your hair and body thoroughly to remove the shampoo.  4. Use CHG as you would any other liquid soap. You can apply CHG directly to the skin and wash gently with a scrungie or a clean washcloth.   5. Apply the CHG Soap to your body ONLY FROM THE NECK DOWN.  Do not use on open wounds or open sores. Avoid contact with your eyes, ears, mouth and genitals (private parts). Wash Face and genitals (private parts)  with your normal soap.  6. Wash thoroughly, paying special attention to the area where your surgery will be performed.  7. Thoroughly rinse your body with warm water from the neck down.  8. DO NOT shower/wash with your normal soap after using and rinsing off the CHG Soap.  9. Pat yourself dry with a CLEAN TOWEL.  10. Wear CLEAN PAJAMAS to bed the night before surgery, wear comfortable clothes the morning of surgery  11. Place CLEAN SHEETS on your bed the night of your first shower and DO NOT SLEEP WITH PETS.  Day of Surgery:  Do not apply any deodorants/lotions.  Please wear clean clothes to the hospital/surgery center.   Remember to brush your teeth WITH YOUR REGULAR TOOTHPASTE.   Contacts, dentures or bridgework may not be worn into surgery.  Leave your suitcase in the car.  After surgery it may be brought to your room.  For patients admitted to the hospital, discharge time will be determined by your treatment team.  Patients discharged the day of surgery will not be allowed to drive home.

## 2018-03-09 NOTE — Progress Notes (Signed)
Cardiology Office Note   Date:  03/10/2018   ID:  [name], DOB 05-Mar-1951, MRN [id number]  PCP:  Maurice Small, MD  Cardiologist: Johnsie Cancel  Chief Complaint  Patient presents with  . Follow-up  . Congestive Heart Failure     History of Present Illness: Taylor [name] is a 67 y.o. female who presents for ongoing assessment and management of CHF with know history of PAD and high end RICA stenosis of 70%. She also had a history of COPD and ongoing tobacco abuse. She was last seen in the office on 02/25/2018, with echo revealing severe LV dysfunction of 15%. NM study was negative for ischemia.   Due to the systolic dysfunction and need to have nasal septum repair, the patient was sent for cardiac cath to evaluate coronary arteries more thoroughly prior to proceeding with surgery.   Cath on 02/27/2018 revealed non-obstructive CAD, with 40% LAD stenosis, EF of 25%-35% with normal LVEDP. She was cleared to have nasal surgery. She called our office on 03/06/2018 with complaints of joint pain and questioned whether bisoprolol was causing this, as it was newly ordered on last office visit, due to tachycardia and reduced EF. The pharmacist did not feel that this was a common side effect of bisoprolol.    She states that the joint swelling became worse when she took the bisoprolol at the lower dose of 2.5 mg. She stopped it for a couple of days and the joint edema resolved.   She did not have nasal surgery as she was feeling so badly and wants to wait until she feels better to proceed She also has a brace on her left leg due to achilles tendon tear.    Past Medical History:  Diagnosis Date  . Allergy    seasonal allergies  . Arthritis    Bilateral Knees  . Carotid artery occlusion   . Carpal tunnel syndrome   . CHF (congestive heart failure) ([place])    was "misdiagnosed" and after a heart cath was told there was nothing wrong.; EF 30% with anterior/inferior WMAs 06/17/16; EF 55-65% with 55% mLAD  by 06/25/16 cath)  . COPD (chronic obstructive pulmonary disease) ([place])   . Dilated cardiomyopathy ([place]) 02/27/2018  . GERD (gastroesophageal reflux disease)   . Left bundle branch block   . Wears dentures   . Wears glasses     Past Surgical History:  Procedure Laterality Date  . ABDOMINAL HYSTERECTOMY  1974   still has ovaries  . BACK SURGERY  2016   lower back discectomy  . CARPAL TUNNEL RELEASE  08/15/2011   Procedure: CARPAL TUNNEL RELEASE;  Surgeon: Cammie Sickle., MD;  Location: [place];  Service: Orthopedics;  Laterality: Right;  . CATARACT EXTRACTION W/PHACO Right 10/04/2013   Procedure: CATARACT EXTRACTION PHACO AND INTRAOCULAR LENS PLACEMENT (IOC);  Surgeon: Tonny Branch, MD;  Location: [facility];  Service: Ophthalmology;  Laterality: Right;  CDE 12.10  . CATARACT EXTRACTION W/PHACO Left 10/28/2013   Procedure: CATARACT EXTRACTION PHACO AND INTRAOCULAR LENS PLACEMENT (IOC);  Surgeon: Tonny Branch, MD;  Location: [facility];  Service: Ophthalmology;  Laterality: Left;  CDE: 9.10  . COLONOSCOPY  2006   [place], repeat 2012  . CYST EXCISION     vaginal X3  . EXCISION MORTON'S NEUROMA  11/14/2011   Procedure: EXCISION MORTON'S NEUROMA;  Surgeon: Marcheta Grammes, DPM;  Location: [facility];  Service: Orthopedics;  Laterality: Right;  Excision of Neuroma 2nd Interspace Right Foot  .  FOOT NEUROMA SURGERY  2008   right  . HERNIA REPAIR  2000   right inguinal  . KNEE SURGERY  2012   left, cartilage repair  . KNEE SURGERY  2005   left knee, cartilage repair  . KNEE SURGERY  2012   left, cartilage repair  . LEFT HEART CATH AND CORONARY ANGIOGRAPHY N/A 02/27/2018   Procedure: LEFT HEART CATH AND CORONARY ANGIOGRAPHY;  Surgeon: Martinique, Peter M, MD;  Location: [facility];  Service: Cardiovascular;  Laterality: N/A;  . RIGHT/LEFT HEART CATH AND CORONARY ANGIOGRAPHY N/A 06/25/2016   Procedure: Right/Left Heart Cath and Coronary Angiography;  Surgeon: Jolaine Artist, MD;  Location: [facility];  Service: Cardiovascular;  Laterality: N/A;  . ULTRASOUND GUIDANCE FOR VASCULAR ACCESS  02/27/2018   Procedure: Ultrasound Guidance For Vascular Access;  Surgeon: Martinique, Peter M, MD;  Location: [facility];  Service: Cardiovascular;;     Current Outpatient Medications  Medication Sig Dispense Refill  . aspirin EC 81 MG tablet Take 1 tablet (81 mg total) by mouth daily. 90 tablet 3  . bisoprolol (ZEBETA) 5 MG tablet Take 0.5 tablets (2.5 mg total) by mouth daily. (Patient taking differently: Take 2.5 mg by mouth every other day. ) 15 tablet 11  . cetirizine (ZYRTEC) 10 MG tablet Take 10 mg by mouth daily as needed for allergies.     . cyclobenzaprine (FLEXERIL) 5 MG tablet Take 5 mg by mouth daily as needed for muscle spasms.     . fluticasone (FLONASE) 50 MCG/ACT nasal spray Place 2 sprays into both nostrils at bedtime.     . predniSONE (DELTASONE) 50 MG tablet 1 tablet 02/27/18 12:30 AM, 1 tablet 02/27/18 6:30 AM, 1 tablet and benadryl 50 mg just prior to leaving for hospital morning of procedure. 3 tablet 0   No current facility-administered medications for this visit.     Allergies:   Contrast media [iodinated diagnostic agents]; Gabapentin; Hydrocodone; and Tetracyclines & related    Social History:  The patient  reports that she has been smoking cigarettes. She has a 20.50 pack-year smoking history. She has never used smokeless tobacco. She reports current alcohol use. She reports that she does not use drugs.   Family History:  The patient's family history includes Aneurysm in her mother; Cancer in her brother; Heart attack in her father; Heart disease (age of onset: 49) in her father; Liver disease in her brother.    ROS: All other systems are reviewed and negative. Unless otherwise mentioned in H&P    PHYSICAL EXAM: VS:  Ht 5\' 3"  (1.6 m)   Wt 116 lb 3.2 oz (52.7 kg) Comment: with boot  BMI 20.58 kg/m  , BMI Body mass index is  20.58 kg/m. GEN: Well nourished, well developed, in no acute distress HEENT: normal Neck: no JVD, carotid bruits, or masses Cardiac: RRR; tachycardic. no murmurs, rubs, or gallops,no edema  Respiratory:  Clear to auscultation bilaterally, normal work of breathing. NO further wheezing as auscultated on last visit.  GI: soft, nontender, nondistended, + BS MS: no deformity or atrophy. Left lower leg brace/walking cast.  Skin: warm and dry, no rash Neuro:  Strength and sensation are intact Psych: euthymic mood, full affect   EKG:  Not completed this office visit.   Recent Labs: 02/25/2018: BUN 7; Creatinine, Ser 0.69; Hemoglobin 11.5; Platelets 413; Potassium 3.9; Sodium 142    Lipid Panel    Component Value Date/Time   CHOL 194  08/27/2010 1017   TRIG 66 08/27/2010 1017   HDL 53 08/27/2010 1017   CHOLHDL 3.7 08/27/2010 1017   VLDL 13 08/27/2010 1017   LDLCALC 128 (H) 08/27/2010 1017      Wt Readings from Last 3 Encounters:  03/10/18 116 lb 3.2 oz (52.7 kg)  02/27/18 119 lb (54 kg)  02/25/18 119 lb 6.4 oz (54.2 kg)      Other studies Reviewed: Cardiac Cath 02/27/2018  Prox LAD lesion is 40% stenosed.  There is severe left ventricular systolic dysfunction.  LV end diastolic pressure is normal.  The left ventricular ejection fraction is 25-35% by visual estimate.   1. Nonobstructive CAD 2. Severe LV dysfunction with global hypokinesis 3. Normal LV filling pressures.  Plan: optimize medication for CHF. She is clear to proceed with sinus surgery.  ASSESSMENT AND PLAN:  1. HFrEF:Most recent cardiac cath as above reveling LVEF of 25%-30%. She did not tolerate change to bisoprolol due to joint swelling and pain. I will switch her to Corlanor 2.5 mg BID (starting dose is 5 mg BID) but she is very sensitive to medications and therefore I will start at very low dose to begin with and leave room for titration. She will be seen on close follow up.   I have explained to her  that she will feel weak and tired on this medication as this will decrease her HR. She will need to have lower HR and BP due LV dysfunction. Can consider ICD if necessary once she is on these medications for 3 months. Due to hypotension will not start spironolactone or ACE at this time.   2. CAD: Cardiac cath revealed 40% LAD stenosis. No intervention was necessary.   3. COPD: No active wheezing.     Current medicines are reviewed at length with the patient today.    Labs/ tests ordered today include: None  Phill Myron. [place] Pugh, ANP, AACC   03/10/2018 8:40 AM    [facility] [address] Office [phone] Fax [phone]

## 2018-03-09 NOTE — Progress Notes (Signed)
Patient calcelled pre-op appointment

## 2018-03-10 ENCOUNTER — Ambulatory Visit: Payer: Medicare HMO | Admitting: Adult Health

## 2018-03-10 ENCOUNTER — Encounter: Payer: Self-pay | Admitting: Adult Health

## 2018-03-10 VITALS — BP 102/60 | HR 73 | Ht 63.0 in | Wt 116.2 lb

## 2018-03-10 DIAGNOSIS — I2510 Atherosclerotic heart disease of native coronary artery without angina pectoris: Secondary | ICD-10-CM

## 2018-03-10 DIAGNOSIS — I519 Heart disease, unspecified: Secondary | ICD-10-CM | POA: Diagnosis not present

## 2018-03-10 MED ORDER — IVABRADINE HCL 5 MG PO TABS: 2.5000 mg | ORAL_TABLET | Freq: Two times a day (BID) | ORAL | 12 refills | Status: DC

## 2018-03-10 NOTE — Patient Instructions (Addendum)
Follow-Up: You will need a follow up appointment in 3 weeks.  You may see [name], MD, [name], DNP, AACC  or one of the following Advanced Practice Providers on your designated Care Team:  Truitt Merle, NP   [name], NP   [name], NP     Medication Instructions:  STOP BISOPROLOL  START CORLANOR 2.5MG  (1/2 TAB) TWICE DAILY   If you need a refill on your cardiac medications before your next appointment, please call your pharmacy.  Labwork: When you have labs (blood work) and your tests are completely normal, you will receive your results ONLY by [place] (if you have MyChart) -OR- A paper copy in the mail.  At [facility], you and your health needs are our priority.  As part of our continuing mission to provide you with exceptional heart care, we have created designated Provider Care Teams.  These Care Teams include your primary Cardiologist (physician) and Advanced Practice Providers (APPs -  Physician Assistants and Nurse Practitioners) who all work together to provide you with the care you need, when you need it.  Thank you for choosing [facility]!!

## 2018-03-10 NOTE — Addendum Note (Signed)
Addended by: Waylan Rocher on: 03/10/2018 09:35 AM   Modules accepted: Orders

## 2018-03-11 ENCOUNTER — Encounter (HOSPITAL_COMMUNITY): Admission: RE | Payer: Self-pay | Source: Ambulatory Visit

## 2018-03-11 ENCOUNTER — Ambulatory Visit (HOSPITAL_COMMUNITY): Admission: RE | Admit: 2018-03-11 | Payer: Medicare HMO | Source: Ambulatory Visit | Admitting: Otolaryngology

## 2018-03-11 SURGERY — SEPTOPLASTY, NOSE, WITH NASAL TURBINATE REDUCTION
Anesthesia: General | Laterality: Left

## 2018-03-16 ENCOUNTER — Ambulatory Visit (INDEPENDENT_AMBULATORY_CARE_PROVIDER_SITE_OTHER): Payer: Medicare HMO | Admitting: Family Medicine

## 2018-03-16 ENCOUNTER — Encounter (INDEPENDENT_AMBULATORY_CARE_PROVIDER_SITE_OTHER): Payer: Self-pay | Admitting: Family Medicine

## 2018-03-16 DIAGNOSIS — M79672 Pain in left foot: Secondary | ICD-10-CM | POA: Diagnosis not present

## 2018-03-16 DIAGNOSIS — M25562 Pain in left knee: Secondary | ICD-10-CM

## 2018-03-16 MED ORDER — PREDNISONE 10 MG PO TABS: ORAL_TABLET | ORAL | 0 refills | Status: DC

## 2018-03-16 NOTE — Progress Notes (Signed)
Office Visit Note   Patient: Taylor [name]           Date of Birth: Mar 07, 1951           MRN: [id number] Visit Date: 03/16/2018 Requested by: Maurice Small, MD [place], [place] [id number] PCP: Maurice Small, MD  Subjective: Chief Complaint  Patient presents with  . Left Lower Leg - Pain    Pain started in knee, went to calf and into the foot.  Started 02/28/18, [place]. FWB in cam boot.    HPI: She is a 67 year old seeking another opinion regarding left knee and heel pain.  6 or 7 months ago she recalls taking an antibiotic and shortly thereafter developing severe pain in her left Achilles area.  Pain settled down after a while but never went away completely.  Then a few weeks ago she was placed on a beta-blocker and after a few days, she developed severe pain in her left knee and Achilles area.  She went to her previous orthopedist who took some x-rays and gave her an intramuscular steroid injection.  Her pain has improved, not gone completely but much better today.  She did not feel that the doctor and office staff treated her well last visit and she came here to get another opinion.  She is frustrated that she keeps having pain flareups without trauma.  She seems to have reactions to a lot of different medications.  Looking back through her record, she has seen a hand surgeon a couple years ago and based on x-rays was diagnosed with pseudogout.               ROS: She has cardiomyopathy.  All other systems were reviewed and are negative.  Objective: Vital Signs: There were no vitals taken for this visit.  Physical Exam:  Left knee: Trace effusion, no warmth or erythema.  Minimal tenderness to palpation around the knee.  Full flexion and extension compared to the right.  No significant joint line tenderness. Left Achilles: Very slight tenderness to palpation at the Achilles diffusely with no defect, no nodules.  Able to plantarflex with good strength and  minimal pain.  Trace edema around the ankle but no ankle effusion.  Imaging: None today.  Assessment & Plan: 1.  Resolving left Achilles area and knee pain, suspect that she might have had a pseudogout flare. -Oral prednisone prescription given in case it flares up again. -In the future if she has another joint effusion, she will come in for aspiration and we will send the fluid for analysis to confirm diagnosis. -Could contemplate daily dosing of colchicine for prevention in the future if needed.   Follow-Up Instructions: No follow-ups on file.      Procedures: No procedures performed  No notes on file    PMFS History: Patient Active Problem List   Diagnosis Date Noted  . Dilated cardiomyopathy ([place]) 02/27/2018  . Occlusion and stenosis of carotid artery without mention of cerebral infarction 11/17/2012  . Pain in joint, ankle and foot 02/08/2011  . Difficulty in walking(719.7) 02/08/2011  . Avulsion fracture of ankle 02/08/2011  . Stiffness of joint, not elsewhere classified, ankle and foot 02/08/2011  . Urinary tract infection, site not specified 09/10/2010   Past Medical History:  Diagnosis Date  . Allergy    seasonal allergies  . Arthritis    Bilateral Knees  . Carotid artery occlusion   . Carpal tunnel syndrome   . CHF (congestive  heart failure) ([place])    was "misdiagnosed" and after a heart cath was told there was nothing wrong.; EF 30% with anterior/inferior WMAs 06/17/16; EF 55-65% with 55% mLAD by 06/25/16 cath)  . COPD (chronic obstructive pulmonary disease) ([place])   . Dilated cardiomyopathy ([place]) 02/27/2018  . GERD (gastroesophageal reflux disease)   . Left bundle branch block   . Wears dentures   . Wears glasses     Family History  Problem Relation Age of Onset  . Aneurysm Mother   . Heart disease Father 40       died of MI  . Heart attack Father   . Cancer Brother        unknown  . Liver disease Brother        1 brother died of cirrhosis    Past  Surgical History:  Procedure Laterality Date  . ABDOMINAL HYSTERECTOMY  1974   still has ovaries  . BACK SURGERY  2016   lower back discectomy  . CARPAL TUNNEL RELEASE  08/15/2011   Procedure: CARPAL TUNNEL RELEASE;  Surgeon: Cammie Sickle., MD;  Location: [place];  Service: Orthopedics;  Laterality: Right;  . CATARACT EXTRACTION W/PHACO Right 10/04/2013   Procedure: CATARACT EXTRACTION PHACO AND INTRAOCULAR LENS PLACEMENT (IOC);  Surgeon: Tonny Branch, MD;  Location[facility];  Service: Ophthalmology;  Laterality: Right;  CDE 12.10  . CATARACT EXTRACTION W/PHACO Left 10/28/2013   Procedure: CATARACT EXTRACTION PHACO AND INTRAOCULAR LENS PLACEMENT (IOC);  Surgeon: Tonny Branch, MD;  Location[facility];  Service: Ophthalmology;  Laterality: Left;  CDE: 9.10  . COLONOSCOPY  2006   [place], repeat 2012  . CYST EXCISION     vaginal X3  . EXCISION MORTON'S NEUROMA  11/14/2011   Procedure: EXCISION MORTON'S NEUROMA;  Surgeon: Marcheta Grammes, DPM;  Location[facility];  Service: Orthopedics;  Laterality: Right;  Excision of Neuroma 2nd Interspace Right Foot  . FOOT NEUROMA SURGERY  2008   right  . HERNIA REPAIR  2000   right inguinal  . KNEE SURGERY  2012   left, cartilage repair  . KNEE SURGERY  2005   left knee, cartilage repair  . KNEE SURGERY  2012   left, cartilage repair  . LEFT HEART CATH AND CORONARY ANGIOGRAPHY N/A 02/27/2018   Procedure: LEFT HEART CATH AND CORONARY ANGIOGRAPHY;  Surgeon: Martinique, Peter M, MD;  Location: [place] [facility];  Service: Cardiovascular;  Laterality: N/A;  . RIGHT/LEFT HEART CATH AND CORONARY ANGIOGRAPHY N/A 06/25/2016   Procedure: Right/Left Heart Cath and Coronary Angiography;  Surgeon: Jolaine Artist, MD;  Location: [place] [facility];  Service: Cardiovascular;  Laterality: N/A;  . ULTRASOUND GUIDANCE FOR VASCULAR ACCESS  02/27/2018   Procedure: Ultrasound Guidance For Vascular Access;  Surgeon: Martinique, Peter M, MD;  Location: [facility] [facility];  Service: Cardiovascular;;   Social History   Occupational History  . Not on file  Tobacco Use  . Smoking status: Current Every Day Smoker    Packs/day: 0.50    Years: 41.00    Pack years: 20.50    Types: Cigarettes  . Smokeless tobacco: Never Used  Substance and Sexual Activity  . Alcohol use: Yes    Comment: rare  . Drug use: No  . Sexual activity: Yes    Birth control/protection: Surgical    Comment: widowed; has boyfriend; works for [employer], exercise - walk, hiking, [employer]

## 2018-03-30 NOTE — Progress Notes (Signed)
Cardiology Office Note   Date:  03/31/2018   ID:  Taylor, [name] [birth year], MRN [id number]  PCP:  Maurice Small, MD  Cardiologist:  Stanford Breed (Changed from [place]) Chief Complaint  Patient presents with  . Follow-up  . Coronary Artery Disease     History of Present Illness: Taylor [name] is a 68 y.o. female who presents for ongoing assessment and management of CHF with know history of PAD and high end RICA stenosis of 70%. She also had a history of COPD and ongoing tobacco abuse. She was last seen in the office on 02/25/2018, with echo revealing severe LV dysfunction of 15%. NM study was negative for ischemia.   Due to the systolic dysfunction and need to have nasal septum repair, the patient was sent for cardiac cath to evaluate coronary arteries more thoroughly prior to proceeding with surgery.   Cath on 02/27/2018 revealed non-obstructive CAD, with 40% LAD stenosis, EF of 25%-35% with normal LVEDP. She was cleared to have nasal surgery. She called our office on 03/06/2018 with complaints of joint pain and questioned whether bisoprolol was causing this, as it was newly ordered on last office visit, due to tachycardia and reduced EF. The pharmacist did not feel that this was a common side effect of bisoprolol.  She was switched to Corlanor 2.5 mg (lower than starting dose of 5 mg) to begin with to help with HR control.   She did not have nasal surgery as she was feeling so badly and wants to wait until she feels better to proceed She also has a brace on her left leg due to achilles tendon tear.  She also has a history of pseudogout and was seen recently by Dr. Junius Roads, orthopedic physician. She was started on steroid dose pack with possible colchicine for prevention if necessary.    She comes today with continued chronic pain and fatigue. She denies significant chest pain but has chest pressure. She continues to smoke heavily. She is intolerant to multiple medications which would optimize  her heart failure improvement and has failed BB, Entresto, ACE therapy due to profound fatigue and hypotension. She has stopped her  Prednisone taper two days ago after finishing regimen.   .  Past Medical History:  Diagnosis Date  . Allergy    seasonal allergies  . Arthritis    Bilateral Knees  . Carotid artery occlusion   . Carpal tunnel syndrome   . CHF (congestive heart failure) ([place])    was "misdiagnosed" and after a heart cath was told there was nothing wrong.; EF 30% with anterior/inferior WMAs 06/17/16; EF 55-65% with 55% mLAD by 06/25/16 cath)  . COPD (chronic obstructive pulmonary disease) ([place])   . Dilated cardiomyopathy ([place]) 02/27/2018  . GERD (gastroesophageal reflux disease)   . Left bundle branch block   . Wears dentures   . Wears glasses     Past Surgical History:  Procedure Laterality Date  . ABDOMINAL HYSTERECTOMY  1974   still has ovaries  . BACK SURGERY  2016   lower back discectomy  . CARPAL TUNNEL RELEASE  08/15/2011   Procedure: CARPAL TUNNEL RELEASE;  Surgeon: Cammie Sickle., MD;  Location: [facility];  Service: Orthopedics;  Laterality: Right;  . CATARACT EXTRACTION W/PHACO Right 10/04/2013   Procedure: CATARACT EXTRACTION PHACO AND INTRAOCULAR LENS PLACEMENT (IOC);  Surgeon: Tonny Branch, MD;  [facility];  Service: Ophthalmology;  Laterality: Right;  CDE 12.10  . CATARACT EXTRACTION W/PHACO Left 10/28/2013  Procedure: CATARACT EXTRACTION PHACO AND INTRAOCULAR LENS PLACEMENT (IOC);  Surgeon: Tonny Branch, MD;  Location[facility];  Service: Ophthalmology;  Laterality: Left;  CDE: 9.10  . COLONOSCOPY  2006   [place], repeat 2012  . CYST EXCISION     vaginal X3  . EXCISION MORTON'S NEUROMA  11/14/2011   Procedure: EXCISION MORTON'S NEUROMA;  Surgeon: Marcheta Grammes, DPM;  Location: [facility];  Service: Orthopedics;  Laterality: Right;  Excision of Neuroma 2nd Interspace Right Foot  . FOOT NEUROMA SURGERY  2008   right  . HERNIA  REPAIR  2000   right inguinal  . KNEE SURGERY  2012   left, cartilage repair  . KNEE SURGERY  2005   left knee, cartilage repair  . KNEE SURGERY  2012   left, cartilage repair  . LEFT HEART CATH AND CORONARY ANGIOGRAPHY N/A 02/27/2018   Procedure: LEFT HEART CATH AND CORONARY ANGIOGRAPHY;  Surgeon: Martinique, Peter M, MD;  Location: [facility];  Service: Cardiovascular;  Laterality: N/A;  . RIGHT/LEFT HEART CATH AND CORONARY ANGIOGRAPHY N/A 06/25/2016   Procedure: Right/Left Heart Cath and Coronary Angiography;  Surgeon: Jolaine Artist, MD;  Location: [facility];  Service: Cardiovascular;  Laterality: N/A;  . ULTRASOUND GUIDANCE FOR VASCULAR ACCESS  02/27/2018   Procedure: Ultrasound Guidance For Vascular Access;  Surgeon: Martinique, Peter M, MD;  Location: [facility];  Service: Cardiovascular;;     Current Outpatient Medications  Medication Sig Dispense Refill  . aspirin EC 81 MG tablet Take 1 tablet (81 mg total) by mouth daily. 90 tablet 3  . cetirizine (ZYRTEC) 10 MG tablet Take 10 mg by mouth daily as needed for allergies.     . cyclobenzaprine (FLEXERIL) 5 MG tablet Take 5 mg by mouth daily as needed for muscle spasms.     . fluticasone (FLONASE) 50 MCG/ACT nasal spray Place 2 sprays into both nostrils at bedtime.     . ivabradine (CORLANOR) 5 MG TABS tablet Take 0.5 tablets (2.5 mg total) by mouth 2 (two) times daily with a meal. 60 tablet 12  . predniSONE (DELTASONE) 10 MG tablet Take as directed for 12 days.  Daily dose 6,6,5,5,4,4,3,3,2,2,1,1. (Patient not taking: Reported on 03/31/2018) 42 tablet 0   No current facility-administered medications for this visit.     Allergies:   Bisoprolol; Contrast media [iodinated diagnostic agents]; Gabapentin; Hydrocodone; and Tetracyclines & related    Social History:  The patient  reports that she has been smoking cigarettes. She has a 20.50 pack-year smoking history. She has never used smokeless tobacco. She reports  current alcohol use. She reports that she does not use drugs.   Family History:  The patient's family history includes Aneurysm in her mother; Cancer in her brother; Heart attack in her father; Heart disease (age of onset: 1) in her father; Liver disease in her brother.    ROS: All other systems are reviewed and negative. Unless otherwise mentioned in H&P    PHYSICAL EXAM: VS:  BP (!) 163/89 (BP Location: Left Arm)   Pulse (!) 124   Ht 5' 3.5" (1.613 m)   Wt 117 lb 12.8 oz (53.4 kg)   BMI 20.54 kg/m  , BMI Body mass index is 20.54 kg/m. GEN: Well nourished, well developed, in no acute distress HEENT: normal Neck: no JVD, carotid bruits, or masses Cardiac: RRR; tachycardia, no murmurs, rubs, or gallops,no edema  Respiratory:  Clear to auscultation bilaterally, normal work of breathing GI:  soft, nontender, nondistended, + BS MS: no deformity or atrophy Skin: warm and dry, no rash Neuro:  Strength and sensation are intact Psych: euthymic mood, full affect   EKG:  Not completed this office visit.   Recent Labs: 02/25/2018: BUN 7; Creatinine, Ser 0.69; Hemoglobin 11.5; Platelets 413; Potassium 3.9; Sodium 142    Lipid Panel    Component Value Date/Time   CHOL 194 08/27/2010 1017   TRIG 66 08/27/2010 1017   HDL 53 08/27/2010 1017   CHOLHDL 3.7 08/27/2010 1017   VLDL 13 08/27/2010 1017   LDLCALC 128 (H) 08/27/2010 1017      Wt Readings from Last 3 Encounters:  03/31/18 117 lb 12.8 oz (53.4 kg)  03/10/18 116 lb 3.2 oz (52.7 kg)  02/27/18 119 lb (54 kg)      Other studies Reviewed: Cardiac Cath 02/27/2018  Prox LAD lesion is 40% stenosed.  There is severe left ventricular systolic dysfunction.  LV end diastolic pressure is normal.  The left ventricular ejection fraction is 25-35% by visual estimate.   1. Nonobstructive CAD 2. Severe LV dysfunction with global hypokinesis 3. Normal LV filling pressures.  ASSESSMENT AND PLAN:  1. NICM: EF of 35% per recent  cardiac cath. She is intolerant to multiple medications that would optimize improvement in heart function.She has been tried on BB ACE, and gets too dizzy or fatigued. She refuses any new medications at this time. She was taking Corlanor 2.5 mg BID but was too sleepy and tired on this. She will take 5 mg at HS to assist with daytime sleepiness and dizziness. She will follow up with Dr. Stanford Breed next to be introduced and she has changed from Dr.  Johnsie Cancel.   2.Ongoing tobacco abuse: She is increasing on her cigarette usage due to stress. I have asked her to try to quit as this is affecting her HR and BP negatively.   3 Hypertension: Very elevated today. In light of reduced EF she is in danger of worsening CHF. She does not appear to be volume overloaded today. I have asked her to go up on Corlanor and to stop smoking. I would like her to be on ACE/ ARB or Entresto but she has tried this in the past and does not tolerate making it difficult to optimized medical management. She will see Dr. Stanford Breed on next office visit for further recommendations.   Current medicines are reviewed at length with the patient today.  She is very difficult to treat due to multiple medication intolerances. I have answered multiple questions from patient and family.  Labs/ tests ordered today include: None  Phill Myron. [place], ANP, AACC   03/31/2018 8:01 AM    [facility] [address] Office [phone] Fax [phone]

## 2018-03-31 ENCOUNTER — Ambulatory Visit: Payer: Medicare HMO | Admitting: Adult Health

## 2018-03-31 ENCOUNTER — Encounter: Payer: Self-pay | Admitting: Adult Health

## 2018-03-31 VITALS — BP 163/89 | HR 124 | Ht 63.5 in | Wt 117.8 lb

## 2018-03-31 DIAGNOSIS — I519 Heart disease, unspecified: Secondary | ICD-10-CM | POA: Diagnosis not present

## 2018-03-31 DIAGNOSIS — I43 Cardiomyopathy in diseases classified elsewhere: Secondary | ICD-10-CM

## 2018-03-31 DIAGNOSIS — I10 Essential (primary) hypertension: Secondary | ICD-10-CM | POA: Diagnosis not present

## 2018-03-31 DIAGNOSIS — Z720 Tobacco use: Secondary | ICD-10-CM | POA: Diagnosis not present

## 2018-03-31 NOTE — Patient Instructions (Addendum)
Follow-Up: You will need a follow up appointment in 3 months.  Please call our office 2 months in advance to schedule this appointment.  You may see Kirk Ruths, [place], DNP, AACC  or one of the following Advanced Practice Providers on your designated Care Team:  Kerin Ransom, [place]  Roby Lofts, [place]  Medication Instructions:  Steffanie Dunn 5MG  SAMPLES PROVIDED TAKE ONLY 1/2 TAB NO CHANGES- Your physician recommends that you continue on your current medications as directed. Please refer to the Current Medication list given to you today. If you need a refill on your cardiac medications before your next appointment, please call your pharmacy. Labwork: When you have labs (blood work) and your tests are completely normal, you will receive your results ONLY by [facility] (if you have MyChart) -OR- A paper copy in the mail. At [facility], you and your health needs are our priority.  As part of our continuing mission to provide you with exceptional heart care, we have created designated Provider Care Teams.  These Care Teams include your primary Cardiologist (physician) and Advanced Practice Providers (APPs -  Physician Assistants and Nurse Practitioners) who all work together to provide you with the care you need, when you need it.  Thank you for choosing [facility] at [facility]!!

## 2018-04-03 ENCOUNTER — Encounter (INDEPENDENT_AMBULATORY_CARE_PROVIDER_SITE_OTHER): Payer: Self-pay | Admitting: Family Medicine

## 2018-04-03 ENCOUNTER — Ambulatory Visit (INDEPENDENT_AMBULATORY_CARE_PROVIDER_SITE_OTHER): Payer: Medicare HMO | Admitting: Family Medicine

## 2018-04-03 DIAGNOSIS — M25462 Effusion, left knee: Secondary | ICD-10-CM

## 2018-04-03 DIAGNOSIS — M79605 Pain in left leg: Secondary | ICD-10-CM | POA: Diagnosis not present

## 2018-04-03 NOTE — Progress Notes (Signed)
Office Visit Note   Patient: Taylor [name]           Date of Birth: March 04, 1951           MRN: [id number] Visit Date: 04/03/2018 Requested by: Maurice Small, MD [place], [place] [address] PCP: Maurice Small, MD  Subjective: Chief Complaint  Patient presents with  . Left Knee - Pain    Pain and swelling since yesterday. Hurts to bear weight on it.    HPI: She is here with left knee pain and swelling.  Sudden onset yesterday, no injury.  Can hardly put any weight on it.  No fevers or chills.              ROS: Otherwise noncontributory  Objective: Vital Signs: There were no vitals taken for this visit.  Physical Exam:  Left knee: 2+ effusion with warmth but no erythema.  Tender in the popliteal fossa as well.  Imaging: Musculoskeletal ultrasound: Moderate effusion with some hyperemia on power Doppler imaging.  Posteriorly there is a Bakers cyst.  Assessment & Plan: 1.  Left knee pain with effusion, suspicious for inflammatory arthropathy -Discussed options with patient and we will aspirate and inject her knee today, send the fluid for crystal analysis.   Follow-Up Instructions: No follow-ups on file.      Procedures: Left knee aspiration and injection: After sterile prep with Betadine, injected 3 cc 1% lidocaine without epinephrine then aspirated 20 cc of hazy yellow synovial fluid, then injected 40 mg of methylprednisolone from superolateral approach.     PMFS History: Patient Active Problem List   Diagnosis Date Noted  . Dilated cardiomyopathy ([place]) 02/27/2018  . Deviated septum 12/12/2017  . Chronic sinusitis 05/20/2017  . Cough, persistent 05/20/2017  . ETD (Eustachian tube dysfunction), bilateral 05/20/2017  . Nasal turbinate hypertrophy 05/20/2017  . Occlusion and stenosis of carotid artery without mention of cerebral infarction 11/17/2012  . Pain in joint, ankle and foot 02/08/2011  . Difficulty in walking(719.7) 02/08/2011  .  Avulsion fracture of ankle 02/08/2011  . Stiffness of joint, not elsewhere classified, ankle and foot 02/08/2011  . Urinary tract infection, site not specified 09/10/2010   Past Medical History:  Diagnosis Date  . Allergy    seasonal allergies  . Arthritis    Bilateral Knees  . Carotid artery occlusion   . Carpal tunnel syndrome   . CHF (congestive heart failure) ([place])    was "misdiagnosed" and after a heart cath was told there was nothing wrong.; EF 30% with anterior/inferior WMAs 06/17/16; EF 55-65% with 55% mLAD by 06/25/16 cath)  . COPD (chronic obstructive pulmonary disease) ([place])   . Dilated cardiomyopathy ([place]) 02/27/2018  . GERD (gastroesophageal reflux disease)   . Left bundle branch block   . Wears dentures   . Wears glasses     Family History  Problem Relation Age of Onset  . Aneurysm Mother   . Heart disease Father 76       died of MI  . Heart attack Father   . Cancer Brother        unknown  . Liver disease Brother        1 brother died of cirrhosis    Past Surgical History:  Procedure Laterality Date  . ABDOMINAL HYSTERECTOMY  1974   still has ovaries  . BACK SURGERY  2016   lower back discectomy  . CARPAL TUNNEL RELEASE  08/15/2011   Procedure: CARPAL TUNNEL RELEASE;  Surgeon: Cammie Sickle., MD;  Location: [facility];  Service: Orthopedics;  Laterality: Right;  . CATARACT EXTRACTION W/PHACO Right 10/04/2013   Procedure: CATARACT EXTRACTION PHACO AND INTRAOCULAR LENS PLACEMENT (IOC);  Surgeon: Tonny Branch, MD;  [facility];  Service: Ophthalmology;  Laterality: Right;  CDE 12.10  . CATARACT EXTRACTION W/PHACO Left 10/28/2013   Procedure: CATARACT EXTRACTION PHACO AND INTRAOCULAR LENS PLACEMENT (IOC);  Surgeon: Tonny Branch, MD;  Location[facility];  Service: Ophthalmology;  Laterality: Left;  CDE: 9.10  . COLONOSCOPY  2006   [place], repeat 2012  . CYST EXCISION     vaginal X3  . EXCISION MORTON'S NEUROMA  11/14/2011   Procedure: EXCISION  MORTON'S NEUROMA;  Surgeon: Marcheta Grammes, DPM;  Location[facility];  Service: Orthopedics;  Laterality: Right;  Excision of Neuroma 2nd Interspace Right Foot  . FOOT NEUROMA SURGERY  2008   right  . HERNIA REPAIR  2000   right inguinal  . KNEE SURGERY  2012   left, cartilage repair  . KNEE SURGERY  2005   left knee, cartilage repair  . KNEE SURGERY  2012   left, cartilage repair  . LEFT HEART CATH AND CORONARY ANGIOGRAPHY N/A 02/27/2018   Procedure: LEFT HEART CATH AND CORONARY ANGIOGRAPHY;  Surgeon: Martinique, Peter M, MD;  Location: [facility];  Service: Cardiovascular;  Laterality: N/A;  . RIGHT/LEFT HEART CATH AND CORONARY ANGIOGRAPHY N/A 06/25/2016   Procedure: Right/Left Heart Cath and Coronary Angiography;  Surgeon: Jolaine Artist, MD;  Location: [facility];  Service: Cardiovascular;  Laterality: N/A;  . ULTRASOUND GUIDANCE FOR VASCULAR ACCESS  02/27/2018   Procedure: Ultrasound Guidance For Vascular Access;  Surgeon: Martinique, Peter M, MD;  Location: [facility];  Service: Cardiovascular;;   Social History   Occupational History  . Not on file  Tobacco Use  . Smoking status: Current Every Day Smoker    Packs/day: 0.50    Years: 41.00    Pack years: 20.50    Types: Cigarettes  . Smokeless tobacco: Never Used  Substance and Sexual Activity  . Alcohol use: Yes    Comment: rare  . Drug use: No  . Sexual activity: Yes    Birth control/protection: Surgical    Comment: widowed; has boyfriend; works for [employer], exercise - walk, hiking, [employer]

## 2018-04-04 ENCOUNTER — Telehealth (INDEPENDENT_AMBULATORY_CARE_PROVIDER_SITE_OTHER): Payer: Self-pay | Admitting: Family Medicine

## 2018-04-04 LAB — SYNOVIAL CELL COUNT + DIFF, W/ CRYSTALS
Basophils, %: 0 %
Eosinophils-Synovial: 0 % (ref 0–2)
Lymphocytes-Synovial Fld: 2 % (ref 0–74)
Monocyte/Macrophage: 17 % (ref 0–69)
Neutrophil, Synovial: 81 % — ABNORMAL HIGH (ref 0–24)
Synoviocytes, %: 0 % (ref 0–15)
WBC, Synovial: 17900 cells/uL — ABNORMAL HIGH (ref <150)

## 2018-04-04 NOTE — Telephone Encounter (Signed)
Fluid analysis confirms pseudogout crystals in the knee.

## 2018-04-21 DIAGNOSIS — J0101 Acute recurrent maxillary sinusitis: Secondary | ICD-10-CM | POA: Diagnosis not present

## 2018-04-21 DIAGNOSIS — Z01818 Encounter for other preprocedural examination: Secondary | ICD-10-CM | POA: Diagnosis not present

## 2018-04-27 ENCOUNTER — Telehealth: Payer: Self-pay | Admitting: *Deleted

## 2018-04-27 NOTE — Telephone Encounter (Signed)
   [facility] Pre-operative Risk Assessment    Request for surgical clearance:  1. What type of surgery is being performed? Elective sinus surgery   2. When is this surgery scheduled? TBD   3. What type of clearance is required (medical clearance vs. Pharmacy clearance to hold med vs. Both)? both  4. Are there any medications that need to be held prior to surgery and how long? Aspirin-7 days prior  5. Practice name and name of physician performing surgery? [facility]   6. What is your office phone number [phone]    7.   What is your office fax number [phone]  8.   Anesthesia type (None, local, MAC, general) ? unknown   Fredia Beets 04/27/2018, 1:54 PM  _________________________________________________________________   (provider comments below)

## 2018-04-28 ENCOUNTER — Encounter: Payer: Self-pay | Admitting: Cardiology

## 2018-04-28 NOTE — Telephone Encounter (Signed)
New message   Taylor [name] is returning your call in reference to a referral in proficient.

## 2018-04-28 NOTE — Telephone Encounter (Signed)
This encounter was created in error - please disregard.

## 2018-04-30 NOTE — Telephone Encounter (Signed)
Taylor [name],  You saw Taylor [name] the first of the month.  She had some chest pressure, she now need elective sinus surgery.  Do you think she is stable to have done?  And ok to hold ASA?  Thanks.

## 2018-05-01 NOTE — Telephone Encounter (Signed)
She is okay to have sinus surgery.

## 2018-05-01 NOTE — Telephone Encounter (Signed)
She is okay to hold aspirin also.  Thx Curt Bears

## 2018-05-04 NOTE — Telephone Encounter (Signed)
   Primary Cardiologist: Kirk Ruths, MD  Chart reviewed as part of pre-operative protocol coverage. Given past medical history and time since last visit, based on ACC/AHA guidelines, SHERIN MURDOCH would be at acceptable risk for the planned procedure without further cardiovascular testing.   I will route this recommendation to the requesting party via Epic fax function and remove from pre-op pool.  Please call with questions. Please hold the aspirin for 7 days prior to the surgery and restart as soon as possible after the surgery.   [place], [place] 05/04/2018, 9:21 AM

## 2018-05-04 NOTE — Telephone Encounter (Signed)
Patient stated her procedure has been canceled since November and December. She wanted to know if she could see Dr Stanford Breed sooner. I told patient I would send a message to the schedulers to place her on a call back list so she could be seen sooner. She voiced understanding.

## 2018-05-04 NOTE — Telephone Encounter (Signed)
Left message for patient to contact office to discuss pre op clearance.  I will forward a copy of the clearance letter to requested provider.

## 2018-05-05 NOTE — Telephone Encounter (Signed)
Thank you. She needs to see Dr. Stanford Breed. Hasn't seen her in a while. I have seen her the last 3-4 times.

## 2018-05-06 NOTE — Telephone Encounter (Signed)
Left message for pt to call to schedule follow up with [facility] dnp. No sooner appointments available for dr Stanford Breed.

## 2018-06-23 ENCOUNTER — Telehealth: Payer: Self-pay | Admitting: *Deleted

## 2018-06-23 NOTE — Telephone Encounter (Signed)
Left message for patient to call to discuss appointment options for 07-06-2018.

## 2018-06-23 NOTE — Telephone Encounter (Signed)
Spoke with pt, Follow up scheduled to July pre patient request.

## 2018-06-23 NOTE — Telephone Encounter (Signed)
New Message   Patient returning Debra's call.

## 2018-07-06 ENCOUNTER — Ambulatory Visit: Payer: Medicare HMO | Admitting: Cardiology

## 2018-07-13 DIAGNOSIS — J0101 Acute recurrent maxillary sinusitis: Secondary | ICD-10-CM | POA: Diagnosis not present

## 2018-08-13 ENCOUNTER — Ambulatory Visit: Payer: Medicare HMO | Admitting: Family

## 2018-08-13 ENCOUNTER — Encounter (HOSPITAL_COMMUNITY): Payer: Medicare HMO

## 2018-09-24 ENCOUNTER — Telehealth: Payer: Self-pay

## 2018-09-24 NOTE — Telephone Encounter (Signed)
Left a detailed message for the patient about switching her upcoming appointment to a virtual video or telephone call for the same time and day. Will try calling the patient again.

## 2018-09-28 NOTE — Progress Notes (Signed)
Virtual Visit via Video Note   This visit type was conducted due to national recommendations for restrictions regarding the COVID-19 Pandemic (e.g. social distancing) in an effort to limit this patient's exposure and mitigate transmission in our community.  Due to her co-morbid illnesses, this patient is at least at moderate risk for complications without adequate follow up.  This format is felt to be most appropriate for this patient at this time.  All issues noted in this document were discussed and addressed.  A limited physical exam was performed with this format.  Please refer to the patient's chart for her consent to telehealth for [facility].   Date:  09/29/2018   ID:  Taylor [name], DOB Feb 04, 1951, MRN [id number]  Patient Location:Home Provider Location: Home  PCP:  Maurice Small, MD  Cardiologist:  Dr Stanford Breed  Evaluation Performed:  Follow-Up Visit  Chief Complaint:  FU CM and CAD  History of Present Illness:    Patient previously followed by [name] but now would like to be followed by me. Echo March 2018 showed ejection fraction 30%, mild mitral regurgitation.  Cardiac catheterization at that time revealed a 50 to 60% mid LAD and ejection fraction 55%.  She was treated medically.  Carotid Dopplers November 2019 showed 60 to 79% right and 40 to 59% left stenosis.  Patient then had a nuclear study November 2019 that showed ejection fraction 25%.  There was a fixed anteroseptal and apical defect possibly secondary to left bundle branch block.  No ischemia.  Echo December 2019 showed ejection fraction 25 to 30%, mild mitral regurgitation and mild tricuspid regurgitation.  Patient again underwent cardiac catheterization in December 2019 and was found to have a 40% proximal LAD and 25 to 35% EF.  Filling pressures normal. Patient complained of joint swelling and pain with bisoprolol.  By report she did not tolerate Entresto or ACE inhibitor.  Since last seen, pt denies chest pain,  palpitations or syncope.  She is able to walk greater than 1 mile without dyspnea or chest pain.  She can climb 2 flights of stairs with no symptoms.  The patient does not have symptoms concerning for COVID-19 infection (fever, chills, cough, or new shortness of breath).    Past Medical History:  Diagnosis Date   Allergy    seasonal allergies   Arthritis    Bilateral Knees   Carotid artery occlusion    Carpal tunnel syndrome    CHF (congestive heart failure) ([place])    was "misdiagnosed" and after a heart cath was told there was nothing wrong.; EF 30% with anterior/inferior WMAs 06/17/16; EF 55-65% with 55% mLAD by 06/25/16 cath)   COPD (chronic obstructive pulmonary disease) (HCC)    Dilated cardiomyopathy ([place]) 02/27/2018   GERD (gastroesophageal reflux disease)    Left bundle branch block    Wears dentures    Wears glasses    Past Surgical History:  Procedure Laterality Date   ABDOMINAL HYSTERECTOMY  1974   still has ovaries   BACK SURGERY  2016   lower back discectomy   CARPAL TUNNEL RELEASE  08/15/2011   Procedure: CARPAL TUNNEL RELEASE;  Surgeon: Cammie Sickle., MD;  Location: [place];  Service: Orthopedics;  Laterality: Right;   CATARACT EXTRACTION W/PHACO Right 10/04/2013   Procedure: CATARACT EXTRACTION PHACO AND INTRAOCULAR LENS PLACEMENT (IOC);  Surgeon: Tonny Branch, MD;  [facility];  Service: Ophthalmology;  Laterality: Right;  CDE 12.10   CATARACT EXTRACTION W/PHACO  Left 10/28/2013   Procedure: CATARACT EXTRACTION PHACO AND INTRAOCULAR LENS PLACEMENT (IOC);  Surgeon: Tonny Branch, MD;  [facility]: [facility];  Service: Ophthalmology;  Laterality: Left;  CDE: 9.10   COLONOSCOPY  2006   [place], repeat 2012   CYST EXCISION     vaginal X3   EXCISION MORTON'S NEUROMA  11/14/2011   Procedure: EXCISION MORTON'S NEUROMA;  Surgeon: Marcheta Grammes, DPM;  Location: [facility];  Service: Orthopedics;  Laterality: Right;  Excision of Neuroma  2nd Interspace Right Foot   FOOT NEUROMA SURGERY  2008   right   HERNIA REPAIR  2000   right inguinal   KNEE SURGERY  2012   left, cartilage repair   KNEE SURGERY  2005   left knee, cartilage repair   KNEE SURGERY  2012   left, cartilage repair   LEFT HEART CATH AND CORONARY ANGIOGRAPHY N/A 02/27/2018   Procedure: LEFT HEART CATH AND CORONARY ANGIOGRAPHY;  Surgeon: Martinique, Peter M, MD;  Location: [place] [facility];  Service: Cardiovascular;  Laterality: N/A;   RIGHT/LEFT HEART CATH AND CORONARY ANGIOGRAPHY N/A 06/25/2016   Procedure: Right/Left Heart Cath and Coronary Angiography;  Surgeon: Jolaine Artist, MD;  Location: [place] [facility];  Service: Cardiovascular;  Laterality: N/A;   ULTRASOUND GUIDANCE FOR VASCULAR ACCESS  02/27/2018   Procedure: Ultrasound Guidance For Vascular Access;  Surgeon: Martinique, Peter M, MD;  Location: [place] [facility];  Service: Cardiovascular;;     Current Meds  Medication Sig   aspirin EC 81 MG tablet Take 1 tablet (81 mg total) by mouth daily.   cetirizine (ZYRTEC) 10 MG tablet Take 10 mg by mouth daily as needed for allergies.      Allergies:   Bisoprolol, Contrast media [iodinated diagnostic agents], Gabapentin, Hydrocodone, and Tetracyclines & related   Social History   Tobacco Use   Smoking status: Current Every Day Smoker    Packs/day: 0.50    Years: 41.00    Pack years: 20.50    Types: Cigarettes   Smokeless tobacco: Never Used  Substance Use Topics   Alcohol use: Yes    Comment: rare   Drug use: No     Family Hx: The patient's family history includes Aneurysm in her mother; Cancer in her brother; Heart attack in her father; Heart disease (age of onset: 33) in her father; Liver disease in her brother.  ROS:   Please see the history of present illness.  No Fever, chills  or productive cough All other systems reviewed and are negative.   Recent Labs: 02/25/2018: BUN 7; Creatinine, Ser 0.69; Hemoglobin 11.5;  Platelets 413; Potassium 3.9; Sodium 142   Recent Lipid Panel Lab Results  Component Value Date/Time   CHOL 194 08/27/2010 10:17 AM   TRIG 66 08/27/2010 10:17 AM   HDL 53 08/27/2010 10:17 AM   CHOLHDL 3.7 08/27/2010 10:17 AM   LDLCALC 128 (H) 08/27/2010 10:17 AM    Wt Readings from Last 3 Encounters:  09/29/18 118 lb (53.5 kg)  03/31/18 117 lb 12.8 oz (53.4 kg)  03/10/18 116 lb 3.2 oz (52.7 kg)     Objective:    Vital Signs:  Ht 5' 3.5" (1.613 m)    Wt 118 lb (53.5 kg)    BMI 20.57 kg/m    VITAL SIGNS:  reviewed NAD Answers questions appropriately Normal affect Remainder of physical examination not performed (telehealth visit; coronavirus pandemic)  ASSESSMENT & PLAN:    1. Nonischemic cardiomyopathy-long discussion today with  patient concerning her cardiomyopathy.  Etiology is unclear.  She does not consume significant amounts of alcohol.  Her cardiomyopathy is out of proportion to coronary artery disease.  I will check TSH.  She has been intolerant to multiple medications and is hesitant to try anything new.  I explained the benefit of ARB's and beta-blockers.  She has agreed to try losartan 12.5 mg daily.  Check potassium and renal function in 1 week.  We can add low-dose carvedilol later if she is agreeable.  Once medications fully titrated would need to consider biventricular ICD though unclear to me that she would agree. 2. Chronic systolic congestive heart failure-patient has no CHF symptoms. 3. Carotid artery disease-will need follow-up carotid Dopplers November 2020.  Continue aspirin.  I would like for her to be on a statin long-term.  However she only wants losartan at this point.  Not clear to me that she would tolerate any further medications but we will address in future office visits. 4. Tobacco abuse-patient counseled on discontinuing. 5. Left bundle branch block-if patient ever requires ICD and is agreeable to the procedure would need CRT. 6. Preoperative  evaluation-patient inquired about having sinus surgery.  She does have a cardiomyopathy but has excellent functional capacity with no dyspnea or chest pain.  She may proceed without further cardiac evaluation.  I explained she would be somewhat higher risk but not prohibitive.  COVID-19 Education: The importance of social distancing was discussed today.  Time:   Today, I have spent 25 minutes with the patient with telehealth technology discussing the above problems.     Medication Adjustments/Labs and Tests Ordered: Current medicines are reviewed at length with the patient today.  Concerns regarding medicines are outlined above.   Tests Ordered: No orders of the defined types were placed in this encounter.   Medication Changes: No orders of the defined types were placed in this encounter.   Follow Up: 6-8 weeks  Signed, Kirk Ruths, MD  09/29/2018 7:52 AM    [place]

## 2018-09-29 ENCOUNTER — Encounter

## 2018-09-29 ENCOUNTER — Encounter: Payer: Self-pay | Admitting: Cardiology

## 2018-09-29 ENCOUNTER — Ambulatory Visit: Payer: Medicare HMO | Admitting: Cardiology

## 2018-09-29 ENCOUNTER — Telehealth (INDEPENDENT_AMBULATORY_CARE_PROVIDER_SITE_OTHER): Payer: Medicare HMO | Admitting: Cardiology

## 2018-09-29 VITALS — Ht 63.5 in | Wt 118.0 lb

## 2018-09-29 DIAGNOSIS — I43 Cardiomyopathy in diseases classified elsewhere: Secondary | ICD-10-CM | POA: Diagnosis not present

## 2018-09-29 DIAGNOSIS — Z720 Tobacco use: Secondary | ICD-10-CM | POA: Diagnosis not present

## 2018-09-29 DIAGNOSIS — I6523 Occlusion and stenosis of bilateral carotid arteries: Secondary | ICD-10-CM | POA: Diagnosis not present

## 2018-09-29 MED ORDER — LOSARTAN POTASSIUM 25 MG PO TABS: 12.5000 mg | ORAL_TABLET | Freq: Every day | ORAL | 3 refills | Status: DC

## 2018-09-29 NOTE — Patient Instructions (Signed)
Medication Instructions:  START LOSARTAN 12.5 MG ONCE DAILY= 1/2 OF THE 25 MG TABLET ONCE DAILY If you need a refill on your cardiac medications before your next appointment, please call your pharmacy.   Lab work: Your physician recommends that you return for lab work in: [place] If you have labs (blood work) drawn today and your tests are completely normal, you will receive your results only by: Marland Kitchen MyChart Message (if you have MyChart) OR . A paper copy in the mail If you have any lab test that is abnormal or we need to change your treatment, we will call you to review the results.  Follow-Up: At [facility], you and your health needs are our priority.  As part of our continuing mission to provide you with exceptional heart care, we have created designated Provider Care Teams.  These Care Teams include your primary Cardiologist (physician) and Advanced Practice Providers (APPs -  Physician Assistants and Nurse Practitioners) who all work together to provide you with the care you need, when you need it. Your physician recommends that you schedule a follow-up appointment in:  [place]

## 2018-10-07 DIAGNOSIS — I43 Cardiomyopathy in diseases classified elsewhere: Secondary | ICD-10-CM | POA: Diagnosis not present

## 2018-10-07 LAB — TSH: TSH: 1.1 u[IU]/mL (ref 0.450–4.500)

## 2018-10-07 LAB — BASIC METABOLIC PANEL
BUN/Creatinine Ratio: 9 — ABNORMAL LOW (ref 12–28)
BUN: 7 mg/dL — ABNORMAL LOW (ref 8–27)
CO2: 25 mmol/L (ref 20–29)
Calcium: 8.9 mg/dL (ref 8.7–10.3)
Chloride: 104 mmol/L (ref 96–106)
Creatinine, Ser: 0.8 mg/dL (ref 0.57–1.00)
GFR calc Af Amer: 86 mL/min/{1.73_m2} (ref >59)
GFR calc non Af Amer: 75 mL/min/{1.73_m2} (ref >59)
Glucose: 82 mg/dL (ref 65–99)
Potassium: 4.1 mmol/L (ref 3.5–5.2)
Sodium: 144 mmol/L (ref 134–144)

## 2018-11-11 ENCOUNTER — Telehealth: Payer: Self-pay | Admitting: Cardiology

## 2018-11-11 NOTE — Telephone Encounter (Signed)
New message   Patient states that her husband will be coming with her to office visit with Dr. Stanford Breed. She states that she needs him to remember to ask some questions to the doctor. Please advise.

## 2018-11-11 NOTE — Telephone Encounter (Signed)
Called and left wife message to call back tomorrow and speak with Hilda Blades, unsure of patients circumstances.

## 2018-11-12 NOTE — Progress Notes (Signed)
HPI: FU NICM and CHF. Echo March 2018 showed ejection fraction 30%, mild mitral regurgitation.  Cardiac catheterization at that time revealed a 50 to 60% mid LAD and ejection fraction 55%.  She was treated medically.  Carotid Dopplers November 2019 showed 60 to 79% right and 40 to 59% left stenosis.  Patient then had a nuclear study November 2019 that showed ejection fraction 25%.  There was a fixed anteroseptal and apical defect possibly secondary to left bundle branch block.  No ischemia.  Echo December 2019 showed ejection fraction 25 to 30%, mild mitral regurgitation and mild tricuspid regurgitation.  Patient again underwent cardiac catheterization in December 2019 and was found to have a 40% proximal LAD and 25 to 35% EF.  Filling pressures normal. Patient complained of joint swelling and pain with bisoprolol.  By report she did not tolerate Entresto or ACE inhibitor.  Since last seen, patient notes increased dyspnea on exertion and fatigue.  She denies chest pain, palpitations or syncope.  She does describe some dizziness and headaches.  Current Outpatient Medications  Medication Sig Dispense Refill  . aspirin EC 81 MG tablet Take 1 tablet (81 mg total) by mouth daily. 90 tablet 3  . cetirizine (ZYRTEC) 10 MG tablet Take 10 mg by mouth daily as needed for allergies.     Marland Kitchen losartan (COZAAR) 25 MG tablet Take 0.5 tablets (12.5 mg total) by mouth daily. 45 tablet 3   No current facility-administered medications for this visit.      Past Medical History:  Diagnosis Date  . Allergy    seasonal allergies  . Arthritis    Bilateral Knees  . Carotid artery occlusion   . Carpal tunnel syndrome   . CHF (congestive heart failure) ([place])    was "misdiagnosed" and after a heart cath was told there was nothing wrong.; EF 30% with anterior/inferior WMAs 06/17/16; EF 55-65% with 55% mLAD by 06/25/16 cath)  . COPD (chronic obstructive pulmonary disease) ([place])   . Dilated cardiomyopathy ([place])  02/27/2018  . GERD (gastroesophageal reflux disease)   . Left bundle branch block   . Wears dentures   . Wears glasses     Past Surgical History:  Procedure Laterality Date  . ABDOMINAL HYSTERECTOMY  1974   still has ovaries  . BACK SURGERY  2016   lower back discectomy  . CARPAL TUNNEL RELEASE  08/15/2011   Procedure: CARPAL TUNNEL RELEASE;  Surgeon: Cammie Sickle., MD;  Location: [place];  Service: Orthopedics;  Laterality: Right;  . CATARACT EXTRACTION W/PHACO Right 10/04/2013   Procedure: CATARACT EXTRACTION PHACO AND INTRAOCULAR LENS PLACEMENT (IOC);  Surgeon: Tonny Branch, MD;  [facility];  Service: Ophthalmology;  Laterality: Right;  CDE 12.10  . CATARACT EXTRACTION W/PHACO Left 10/28/2013   Procedure: CATARACT EXTRACTION PHACO AND INTRAOCULAR LENS PLACEMENT (IOC);  Surgeon: Tonny Branch, MD;  [facility];  Service: Ophthalmology;  Laterality: Left;  CDE: 9.10  . COLONOSCOPY  2006   , repeat 2012  . CYST EXCISION     vaginal X3  . EXCISION MORTON'S NEUROMA  11/14/2011   Procedure: EXCISION MORTON'S NEUROMA;  Surgeon: Marcheta Grammes, DPM;  Location: [facility];  Service: Orthopedics;  Laterality: Right;  Excision of Neuroma 2nd Interspace Right Foot  . FOOT NEUROMA SURGERY  2008   right  . HERNIA REPAIR  2000   right inguinal  . KNEE SURGERY  2012   left, cartilage repair  .  KNEE SURGERY  2005   left knee, cartilage repair  . KNEE SURGERY  2012   left, cartilage repair  . LEFT HEART CATH AND CORONARY ANGIOGRAPHY N/A 02/27/2018   Procedure: LEFT HEART CATH AND CORONARY ANGIOGRAPHY;  Surgeon: Martinique, Peter M, MD;  Location: [place] [facility];  Service: Cardiovascular;  Laterality: N/A;  . RIGHT/LEFT HEART CATH AND CORONARY ANGIOGRAPHY N/A 06/25/2016   Procedure: Right/Left Heart Cath and Coronary Angiography;  Surgeon: Jolaine Artist, MD;  Location: [place] [facility];  Service: Cardiovascular;  Laterality: N/A;  . ULTRASOUND GUIDANCE  FOR VASCULAR ACCESS  02/27/2018   Procedure: Ultrasound Guidance For Vascular Access;  Surgeon: Martinique, Peter M, MD;  Location: [place] [facility];  Service: Cardiovascular;;    Social History   Socioeconomic History  . Marital status: Married    Spouse name: Not on file  . Number of children: Not on file  . Years of education: Not on file  . Highest education level: Not on file  Occupational History  . Not on file  Social Needs  . Financial resource strain: Not on file  . Food insecurity    Worry: Not on file    Inability: Not on file  . Transportation needs    Medical: Not on file    Non-medical: Not on file  Tobacco Use  . Smoking status: Current Every Day Smoker    Packs/day: 0.50    Years: 41.00    Pack years: 20.50    Types: Cigarettes  . Smokeless tobacco: Never Used  Substance and Sexual Activity  . Alcohol use: Yes    Comment: rare  . Drug use: No  . Sexual activity: Yes    Birth control/protection: Surgical    Comment: widowed; has boyfriend; works for [employer], exercise - walk, hiking, kayaking  Lifestyle  . Physical activity    Days per week: Not on file    Minutes per session: Not on file  . Stress: Not on file  Relationships  . Social Herbalist on phone: Not on file    Gets together: Not on file    Attends religious service: Not on file    Active member of club or organization: Not on file    Attends meetings of clubs or organizations: Not on file    Relationship status: Not on file  . Intimate partner violence    Fear of current or ex partner: Not on file    Emotionally abused: Not on file    Physically abused: Not on file    Forced sexual activity: Not on file  Other Topics Concern  . Not on file  Social History Narrative  . Not on file    Family History  Problem Relation Age of Onset  . Aneurysm Mother   . Heart disease Father 10       died of MI  . Heart attack Father   . Cancer Brother        unknown  . Liver  disease Brother        1 brother died of cirrhosis    ROS: no fevers or chills, productive cough, hemoptysis, dysphasia, odynophagia, melena, hematochezia, dysuria, hematuria, rash, seizure activity, orthopnea, PND, pedal edema, claudication. Remaining systems are negative.  Physical Exam: Well-developed well-nourished in no acute distress.  Skin is warm and dry.  HEENT is normal.  Neck is supple.  Chest is clear to auscultation with normal expansion.  Cardiovascular exam is  tachycardic Abdominal exam nontender or distended. No masses palpated. Extremities show no edema. neuro grossly intact  ECG-atrial flutter versus ectopic atrial tachycardia, left bundle branch block personally reviewed  A/P  1 nonischemic cardiomyopathy-etiology unclear.  No history of alcohol abuse and no significant coronary disease on catheterization.  TSH normal.  She has been intolerant to multiple medications.  At last office visit we added losartan 12.5 mg daily.  She feels she may be having side effects from this medication but will continue at this point.  Add carvedilol 3.125 mg twice daily.  Once medications fully titrated would need to consider biventricular ICD though she is doubtful she would agree.  2 chronic systolic congestive heart failure-patient has no CHF symptoms.  Continue fluid restriction and low-sodium diet.  3 carotid artery disease-plan to continue aspirin.  She declines statins at this point.  She will need follow-up carotid Dopplers November 2020.  4 tobacco abuse-patient needs to discontinue.  5 left bundle branch block-if patient requires ICD in the future would need to be CRT-D.  6 ectopic atrial tachycardia versus atrial flutter-CHADSvasc 4.  Add apixaban 5 mg twice daily.  Discontinue aspirin.  Add carvedilol 3.125 mg twice daily.  I think her new onset dyspnea on exertion and fatigue is likely related to atrial arrhythmias.  We will arrange TEE guided cardioversion.  I would then  like to have electrophysiology to evaluate for possible ablation.  She would also potentially benefit from CRT-D.  I am not convinced she will tolerate cardiac medications.  Kirk Ruths, MD

## 2018-11-12 NOTE — Telephone Encounter (Signed)
Spoke with pt, they are not able to use a phone for her visit. Okay given for husband to come to the appointment.

## 2018-11-16 ENCOUNTER — Other Ambulatory Visit: Payer: Self-pay | Admitting: *Deleted

## 2018-11-16 ENCOUNTER — Encounter: Payer: Self-pay | Admitting: Cardiology

## 2018-11-16 ENCOUNTER — Ambulatory Visit: Payer: Medicare HMO | Admitting: Cardiology

## 2018-11-16 ENCOUNTER — Other Ambulatory Visit: Payer: Self-pay

## 2018-11-16 VITALS — BP 132/78 | HR 114 | Temp 97.5°F | Ht 63.5 in | Wt 124.0 lb

## 2018-11-16 DIAGNOSIS — I10 Essential (primary) hypertension: Secondary | ICD-10-CM

## 2018-11-16 DIAGNOSIS — Z720 Tobacco use: Secondary | ICD-10-CM | POA: Diagnosis not present

## 2018-11-16 DIAGNOSIS — I43 Cardiomyopathy in diseases classified elsewhere: Secondary | ICD-10-CM

## 2018-11-16 DIAGNOSIS — I4892 Unspecified atrial flutter: Secondary | ICD-10-CM

## 2018-11-16 MED ORDER — CARVEDILOL 3.125 MG PO TABS: 3.1250 mg | ORAL_TABLET | Freq: Two times a day (BID) | ORAL | 3 refills | Status: DC

## 2018-11-16 MED ORDER — APIXABAN 5 MG PO TABS: 5.0000 mg | ORAL_TABLET | Freq: Two times a day (BID) | ORAL | 6 refills | Status: AC

## 2018-11-16 NOTE — Patient Instructions (Addendum)
Medication Instructions:  STOP ASPIRIN  START ELIQUIS 5 MG ONE TABLET TWICE DAILY  START CARVEDILOL 3.125 MG ONE TABLET TWICE DAILY  If you need a refill on your cardiac medications before your next appointment, please call your pharmacy.   You are scheduled for aTEE Cardioversion on 11/23/2018 with Dr. Acie Fredrickson.  Please arrive at the [facility] (Main Entrance A) at [facility]: [address] [place], [place] [address] at 10 am. (1 hour prior to procedure unless lab work is needed; if lab work is needed arrive 1.5 hours ahead)  DIET: Nothing to eat or drink after midnight except a sip of water with medications (see medication instructions below)  Medication Instructions: DO NOT TAKE CARVEDILOL THE MORNING OF THE PROCEDURE  Continue your anticoagulant: ELIQUIS   GO TO [facility] ON Thursday 11/19/2018 @ 1 PM [place] must have a responsible person to drive you home and stay in the waiting area during your procedure. Failure to do so could result in cancellation.  Bring your insurance cards.  *Special Note: Every effort is made to have your procedure done on time. Occasionally there are emergencies that occur at the hospital that may cause delays. Please be patient if a delay does occur.     Follow-Up: At [facility], you and your health needs are our priority.  As part of our continuing mission to provide you with exceptional heart care, we have created designated Provider Care Teams.  These Care Teams include your primary Cardiologist (physician) and Advanced Practice Providers (APPs -  Physician Assistants and Nurse Practitioners) who all work together to provide you with the care you need, when you need it. Your physician recommends that you schedule a follow-up appointment in: [place]

## 2018-11-17 ENCOUNTER — Ambulatory Visit (INDEPENDENT_AMBULATORY_CARE_PROVIDER_SITE_OTHER): Payer: Medicare HMO | Admitting: Family Medicine

## 2018-11-17 ENCOUNTER — Encounter: Payer: Self-pay | Admitting: Family Medicine

## 2018-11-17 DIAGNOSIS — M79641 Pain in right hand: Secondary | ICD-10-CM | POA: Diagnosis not present

## 2018-11-17 DIAGNOSIS — M25531 Pain in right wrist: Secondary | ICD-10-CM

## 2018-11-17 NOTE — Progress Notes (Signed)
Taylor [name] - 68 y.o. female MRN [id number]  Date of birth: 06-11-1950  Office Visit Note: Visit Date: 11/17/2018 PCP: Maurice Small, MD Referred by: Maurice Small, MD  Subjective: Chief Complaint  Patient presents with  . Right Hand - Pain  . Right Wrist - Pain    Picked up a bag of sugar last week and felt pain in the wrist. Has had pain, warmth and swelling in the hand and wrist since then.   HPI: Taylor [name] is a 68 y.o. female who comes in today with right wrist swelling and pain. Reports that she was picking up a bag of sugar and felt a sharp pain on the inside of her wrist. Two days later, wrist began to swell. She started to feel pain in thumb and in ring finger and she noticed swelling in hand and and fingers as well. She reports that she has had prior pseudo-gout flare in knees due to medication. She is currently on losartan. This morning she started carvedilol and eliquis. She also has been eating a lot of fresh tomatoes recently as she has ripe tomatoes in her garden.    ROS Otherwise per HPI.  Assessment & Plan: Visit Diagnoses:  1. Pain of right hand   2. Pain in right wrist    Suspect that swelling and pain in wrist is due to pseudo-gout flare, possibly aggravated by trauma of small wrist injury or less likely, by recent increase in tomato intake. Ultrasound with inflammation and hypoechoic changes in wrist joint and 1st MCP. Discussed starting colchicine vs corticosteroid injection. Since colchicine interacts with carvedilol to cause possible rhabdomyolysis, opted for injection.  Plan:  - corticosteroid injection in right wrist    Follow-up: No follow-ups on file.   Procedures: Right wrist: Injected with 1 cc 1% lidocaine without epi and 20 mg methylprednisolone using ultrasound to guide indirect approach dorsally into wrist between 2nd and 4th compartment tendons.  Clinical History: No specialty comments available.   She reports that she has been smoking  cigarettes. She has a 20.50 pack-year smoking history. She has never used smokeless tobacco. No results for input(s): HGBA1C, LABURIC in the last 8760 hours.  Objective:  VS:  HT:    WT:   BMI:     BP:   HR: bpm  TEMP: ( )  RESP:  Physical Exam  PHYSICAL EXAM: Gen: NAD, alert, cooperative with exam, well-appearing HEENT: clear conjunctiva,  CV:  no edema, capillary refill brisk, normal rate Resp: non-labored Skin: no rashes, normal turgor  Neuro: no gross deficits.  Psych:  alert and oriented  Ortho Exam  Right Hand: Inspection: Diffuse swelling of wrist, loss of landmarks. Swollen 1st and 2nd MCP joints, 1-5 PIP joints  Palpation: TTP over medial wrist, 1st MCP, CMC ROM: Reduced ROM of the digits and wrist secondary to swelling nad pain Strength: 5/5 strength in the forearm, wrist and interosseus muscles Neurovascular: NV intact Special tests: positive tinel's at the carpal tunnel  Imaging: No results found.  Past Medical/Family/Surgical/Social History: Medications & Allergies reviewed per EMR, new medications updated. Patient Active Problem List   Diagnosis Date Noted  . Dilated cardiomyopathy ([place]) 02/27/2018  . Deviated septum 12/12/2017  . Chronic sinusitis 05/20/2017  . Cough, persistent 05/20/2017  . ETD (Eustachian tube dysfunction), bilateral 05/20/2017  . Nasal turbinate hypertrophy 05/20/2017  . Occlusion and stenosis of carotid artery without mention of cerebral infarction 11/17/2012  . Pain in joint, ankle and foot 02/08/2011  .  Difficulty in walking(719.7) 02/08/2011  . Avulsion fracture of ankle 02/08/2011  . Stiffness of joint, not elsewhere classified, ankle and foot 02/08/2011  . Urinary tract infection, site not specified 09/10/2010   Past Medical History:  Diagnosis Date  . Allergy    seasonal allergies  . Arthritis    Bilateral Knees  . Carotid artery occlusion   . Carpal tunnel syndrome   . CHF (congestive heart failure) ([place])    was  "misdiagnosed" and after a heart cath was told there was nothing wrong.; EF 30% with anterior/inferior WMAs 06/17/16; EF 55-65% with 55% mLAD by 06/25/16 cath)  . COPD (chronic obstructive pulmonary disease) ([place])   . Dilated cardiomyopathy ([place]) 02/27/2018  . GERD (gastroesophageal reflux disease)   . Left bundle branch block   . Wears dentures   . Wears glasses    Family History  Problem Relation Age of Onset  . Aneurysm Mother   . Heart disease Father 50       died of MI  . Heart attack Father   . Cancer Brother        unknown  . Liver disease Brother        1 brother died of cirrhosis   Past Surgical History:  Procedure Laterality Date  . ABDOMINAL HYSTERECTOMY  1974   still has ovaries  . BACK SURGERY  2016   lower back discectomy  . CARPAL TUNNEL RELEASE  08/15/2011   Procedure: CARPAL TUNNEL RELEASE;  Surgeon: Cammie Sickle., MD;  Location: [facility];  Service: Orthopedics;  Laterality: Right;  . CATARACT EXTRACTION W/PHACO Right 10/04/2013   Procedure: CATARACT EXTRACTION PHACO AND INTRAOCULAR LENS PLACEMENT (IOC);  Surgeon: Tonny Branch, MD;  Location: [facility];  Service: Ophthalmology;  Laterality: Right;  CDE 12.10  . CATARACT EXTRACTION W/PHACO Left 10/28/2013   Procedure: CATARACT EXTRACTION PHACO AND INTRAOCULAR LENS PLACEMENT (IOC);  Surgeon: Tonny Branch, MD;  Location: [facility];  Service: Ophthalmology;  Laterality: Left;  CDE: 9.10  . COLONOSCOPY  2006   [place], repeat 2012  . CYST EXCISION     vaginal X3  . EXCISION MORTON'S NEUROMA  11/14/2011   Procedure: EXCISION MORTON'S NEUROMA;  Surgeon: Marcheta Grammes, DPM;  Location: [facility];  Service: Orthopedics;  Laterality: Right;  Excision of Neuroma 2nd Interspace Right Foot  . FOOT NEUROMA SURGERY  2008   right  . HERNIA REPAIR  2000   right inguinal  . KNEE SURGERY  2012   left, cartilage repair  . KNEE SURGERY  2005   left knee, cartilage repair  . KNEE SURGERY  2012   left, cartilage  repair  . LEFT HEART CATH AND CORONARY ANGIOGRAPHY N/A 02/27/2018   Procedure: LEFT HEART CATH AND CORONARY ANGIOGRAPHY;  Surgeon: Martinique, Peter M, MD;  Location: [facility];  Service: Cardiovascular;  Laterality: N/A;  . RIGHT/LEFT HEART CATH AND CORONARY ANGIOGRAPHY N/A 06/25/2016   Procedure: Right/Left Heart Cath and Coronary Angiography;  Surgeon: Jolaine Artist, MD;  Location: [facility];  Service: Cardiovascular;  Laterality: N/A;  . ULTRASOUND GUIDANCE FOR VASCULAR ACCESS  02/27/2018   Procedure: Ultrasound Guidance For Vascular Access;  Surgeon: Martinique, Peter M, MD;  Location: [facility];  Service: Cardiovascular;;   Social History   Occupational History  . Not on file  Tobacco Use  . Smoking status: Current Every Day Smoker    Packs/day: 0.50    Years: 41.00  Pack years: 20.50    Types: Cigarettes  . Smokeless tobacco: Never Used  Substance and Sexual Activity  . Alcohol use: Yes    Comment: rare  . Drug use: No  . Sexual activity: Yes    Birth control/protection: Surgical    Comment: widowed; has boyfriend; works for [employer], exercise - walk, hiking, [employer]

## 2018-11-17 NOTE — Progress Notes (Signed)
I saw and examined the patient with Dr. Mayer Masker and agree with assessment and plan as outlined.  Probable pseudogout attack right wrist.  Potential interaction between colchicine and coreg, so will inject instead.  Injected with 1 cc 1% lidocaine without epi and 20 mg methylprednisolone using ultrasound to guide indirect approach dorsally into wrist between 2nd and 4th compartment tendons.

## 2018-11-18 DIAGNOSIS — H26491 Other secondary cataract, right eye: Secondary | ICD-10-CM | POA: Diagnosis not present

## 2018-11-19 ENCOUNTER — Other Ambulatory Visit (HOSPITAL_COMMUNITY)
Admission: RE | Admit: 2018-11-19 | Discharge: 2018-11-19 | Disposition: A | Discharge status: Home / Self Care | Payer: Medicare HMO | Source: Ambulatory Visit | Attending: Cardiovascular Disease | Admitting: Cardiovascular Disease

## 2018-11-19 DIAGNOSIS — Z20828 Contact with and (suspected) exposure to other viral communicable diseases: Secondary | ICD-10-CM | POA: Insufficient documentation

## 2018-11-19 DIAGNOSIS — Z01812 Encounter for preprocedural laboratory examination: Secondary | ICD-10-CM | POA: Insufficient documentation

## 2018-11-19 LAB — SARS CORONAVIRUS 2 (TAT 6-24 HRS): SARS Coronavirus 2: NEGATIVE

## 2018-11-23 ENCOUNTER — Ambulatory Visit (HOSPITAL_COMMUNITY): Payer: Medicare HMO

## 2018-11-23 ENCOUNTER — Other Ambulatory Visit: Payer: Self-pay

## 2018-11-23 ENCOUNTER — Encounter (HOSPITAL_COMMUNITY): Payer: Self-pay

## 2018-11-23 ENCOUNTER — Encounter (HOSPITAL_COMMUNITY)
Admission: RE | Disposition: A | Discharge status: Home / Self Care | Payer: Self-pay | Source: Home / Self Care | Attending: Cardiovascular Disease

## 2018-11-23 ENCOUNTER — Telehealth: Payer: Self-pay | Admitting: Family Medicine

## 2018-11-23 ENCOUNTER — Encounter (HOSPITAL_COMMUNITY): Payer: Self-pay | Admitting: Certified Registered"

## 2018-11-23 ENCOUNTER — Ambulatory Visit (HOSPITAL_COMMUNITY)
Admission: RE | Admit: 2018-11-23 | Discharge: 2018-11-23 | Disposition: A | Discharge status: Home / Self Care | Payer: Medicare HMO | Attending: Cardiovascular Disease | Admitting: Cardiovascular Disease

## 2018-11-23 ENCOUNTER — Telehealth: Payer: Self-pay | Admitting: Cardiology

## 2018-11-23 DIAGNOSIS — I484 Atypical atrial flutter: Secondary | ICD-10-CM

## 2018-11-23 DIAGNOSIS — I4892 Unspecified atrial flutter: Secondary | ICD-10-CM

## 2018-11-23 DIAGNOSIS — I447 Left bundle-branch block, unspecified: Secondary | ICD-10-CM | POA: Insufficient documentation

## 2018-11-23 SURGERY — CANCELLED PROCEDURE

## 2018-11-23 MED ORDER — SODIUM CHLORIDE 0.9 % IV SOLN: INTRAVENOUS | Status: DC

## 2018-11-23 MED ORDER — LOSARTAN POTASSIUM 25 MG PO TABS: 12.5000 mg | ORAL_TABLET | Freq: Every day | ORAL | 3 refills | Status: AC

## 2018-11-23 NOTE — Telephone Encounter (Signed)
It can take a week sometimes.  If still swollen toward the end of the week, could try a different medicine.

## 2018-11-23 NOTE — Telephone Encounter (Signed)
Please return call.

## 2018-11-23 NOTE — Telephone Encounter (Signed)
No need to stop Eliquis or Coreg for eye laser surgery

## 2018-11-23 NOTE — Telephone Encounter (Signed)
[id number] 

## 2018-11-23 NOTE — Telephone Encounter (Signed)
Please advise. Thanks.  

## 2018-11-23 NOTE — Telephone Encounter (Signed)
° ° °  Patient calling with questions regarding Coreg. She is possibly going to have laser cataract surgery and had questions about Coreg. The patient has appointment with eye specialist 9/2 and will provide additional information

## 2018-11-23 NOTE — Progress Notes (Signed)
    Pt was in NSR with BBB when she arrived for her TEE / CV. Procedure was cancelled.   Rhythm Verified by ECG      Mertie Moores, MD  11/23/2018 10:37 AM    [facility] [place],  [address], [place]  [address] Pager [phone] Phone: [phone]; Fax: [phone]

## 2018-11-23 NOTE — Telephone Encounter (Signed)
Patient called advised she had an injection 11/17/2018 and need to know when will the swelling go down in her right hand. The number to contact patient is [phone]

## 2018-11-23 NOTE — Telephone Encounter (Signed)
Returned call to pt/husband he is inquiring about stopping carvidelol and eliquis for a laser eye surgery surgery to have scar tissue removed from one of her eyes. Do they need to be stopped for laser surgery?

## 2018-11-23 NOTE — Telephone Encounter (Signed)
Called patient and advised per Dr Junius Roads.

## 2018-11-24 NOTE — Telephone Encounter (Signed)
Noted  

## 2018-11-24 NOTE — Telephone Encounter (Signed)
Agree with recommendations per pharnacey.  See PCP for pseudogout.

## 2018-11-24 NOTE — Telephone Encounter (Signed)
Returned call notified no need to stop carvidelol and eliquis for a laser eye surgery. She now states that her had "is swollen again with pseudogout" this happened before back in January and [facility] told her to stop in email dated 04-08-18. She will titrate 1 tab daily for one week. Taken off med list. Should she start something else? Please advise

## 2018-11-25 DIAGNOSIS — H26491 Other secondary cataract, right eye: Secondary | ICD-10-CM | POA: Diagnosis not present

## 2018-11-25 DIAGNOSIS — Z961 Presence of intraocular lens: Secondary | ICD-10-CM | POA: Diagnosis not present

## 2018-11-26 NOTE — Progress Notes (Signed)
HPI: FU NICM and CHF. EchoMarch 2018 showed ejection fraction 30%, mild mitral regurgitation. Cardiac catheterization at that time revealed a 50 to 60% mid LAD and ejection fraction 55%. She was treated medically. Carotid Dopplers November 2019 showed 60 to 79% right and 40 to 59% left stenosis. Patient then had a nuclear study November 2019 that showed ejection fraction 25%. There was a fixed anteroseptal and apical defect possibly secondary to left bundle branch block. No ischemia. Echo December 2019 showed ejection fraction 25 to 30%, mild mitral regurgitation and mild tricuspid regurgitation. Patient again underwent cardiac catheterization in December 2019 and was found to have a 40% proximal LAD and 25 to 35%EF. Filling pressures normal. Patient complained of joint swelling and pain with bisoprolol. By report she did not tolerate Entresto or ACE inhibitor. At last office visit patient was noted to be in atrial flutter.  Apixaban added.  She was scheduled for cardioversion but was in sinus rhythm upon arrival for procedure.  Since last seen,  she notes fatigue.  Minimal dyspnea on exertion but no orthopnea, PND, pedal edema, chest pain or syncope.  She has some dizziness at times but can occur both lying, sitting or standing.  They do follow her blood pressure at home and systolic is in the 90 range.  Current Outpatient Medications  Medication Sig Dispense Refill  . apixaban (ELIQUIS) 5 MG TABS tablet Take 1 tablet (5 mg total) by mouth 2 (two) times daily. 60 tablet 6  . carvedilol (COREG) 3.125 MG tablet Take 3.125 mg by mouth 2 (two) times daily with a meal.    . cetirizine (ZYRTEC) 10 MG tablet Take 10 mg by mouth daily.     Marland Kitchen losartan (COZAAR) 25 MG tablet Take 0.5 tablets (12.5 mg total) by mouth daily. 45 tablet 3   No current facility-administered medications for this visit.      Past Medical History:  Diagnosis Date  . Allergy    seasonal allergies  . Arthritis     Bilateral Knees  . Carotid artery occlusion   . Carpal tunnel syndrome   . CHF (congestive heart failure) ([place])    was "misdiagnosed" and after a heart cath was told there was nothing wrong.; EF 30% with anterior/inferior WMAs 06/17/16; EF 55-65% with 55% mLAD by 06/25/16 cath)  . COPD (chronic obstructive pulmonary disease) ([place])   . Dilated cardiomyopathy ([place]) 02/27/2018  . GERD (gastroesophageal reflux disease)   . Left bundle branch block   . Wears dentures   . Wears glasses     Past Surgical History:  Procedure Laterality Date  . ABDOMINAL HYSTERECTOMY  1974   still has ovaries  . BACK SURGERY  2016   lower back discectomy  . CARPAL TUNNEL RELEASE  08/15/2011   Procedure: CARPAL TUNNEL RELEASE;  Surgeon: Cammie Sickle., MD;  Location: [facility];  Service: Orthopedics;  Laterality: Right;  . CATARACT EXTRACTION W/PHACO Right 10/04/2013   Procedure: CATARACT EXTRACTION PHACO AND INTRAOCULAR LENS PLACEMENT (IOC);  Surgeon: Tonny Branch, MD;  [facility];  Service: Ophthalmology;  Laterality: Right;  CDE 12.10  . CATARACT EXTRACTION W/PHACO Left 10/28/2013   Procedure: CATARACT EXTRACTION PHACO AND INTRAOCULAR LENS PLACEMENT (IOC);  Surgeon: Tonny Branch, MD;  [facility];  Service: Ophthalmology;  Laterality: Left;  CDE: 9.10  . COLONOSCOPY  2006   [place], repeat 2012  . CYST EXCISION     vaginal X3  . EXCISION MORTON'S NEUROMA  11/14/2011   Procedure: EXCISION MORTON'S NEUROMA;  Surgeon: Marcheta Grammes, DPM;  [facility];  Service: Orthopedics;  Laterality: Right;  Excision of Neuroma 2nd Interspace Right Foot  . FOOT NEUROMA SURGERY  2008   right  . HERNIA REPAIR  2000   right inguinal  . KNEE SURGERY  2012   left, cartilage repair  . KNEE SURGERY  2005   left knee, cartilage repair  . KNEE SURGERY  2012   left, cartilage repair  . LEFT HEART CATH AND CORONARY ANGIOGRAPHY N/A 02/27/2018   Procedure: LEFT HEART CATH AND CORONARY  ANGIOGRAPHY;  Surgeon: Martinique, Peter M, MD;  Location: [facility];  Service: Cardiovascular;  Laterality: N/A;  . RIGHT/LEFT HEART CATH AND CORONARY ANGIOGRAPHY N/A 06/25/2016   Procedure: Right/Left Heart Cath and Coronary Angiography;  Surgeon: Jolaine Artist, MD;  Location: [facility];  Service: Cardiovascular;  Laterality: N/A;  . ULTRASOUND GUIDANCE FOR VASCULAR ACCESS  02/27/2018   Procedure: Ultrasound Guidance For Vascular Access;  Surgeon: Martinique, Peter M, MD;  Location: [facility];  Service: Cardiovascular;;    Social History   Socioeconomic History  . Marital status: Married    Spouse name: Not on file  . Number of children: Not on file  . Years of education: Not on file  . Highest education level: Not on file  Occupational History  . Not on file  Social Needs  . Financial resource strain: Not on file  . Food insecurity    Worry: Not on file    Inability: Not on file  . Transportation needs    Medical: Not on file    Non-medical: Not on file  Tobacco Use  . Smoking status: Current Every Day Smoker    Packs/day: 0.50    Years: 41.00    Pack years: 20.50    Types: Cigarettes  . Smokeless tobacco: Never Used  Substance and Sexual Activity  . Alcohol use: Yes    Comment: rare  . Drug use: No  . Sexual activity: Yes    Birth control/protection: Surgical    Comment: widowed; has boyfriend; works for [employer], exercise - walk, hiking, kayaking  Lifestyle  . Physical activity    Days per week: Not on file    Minutes per session: Not on file  . Stress: Not on file  Relationships  . Social Herbalist on phone: Not on file    Gets together: Not on file    Attends religious service: Not on file    Active member of club or organization: Not on file    Attends meetings of clubs or organizations: Not on file    Relationship status: Not on file  . Intimate partner violence    Fear of current or ex partner: Not on file     Emotionally abused: Not on file    Physically abused: Not on file    Forced sexual activity: Not on file  Other Topics Concern  . Not on file  Social History Narrative  . Not on file    Family History  Problem Relation Age of Onset  . Aneurysm Mother   . Heart disease Father 37       died of MI  . Heart attack Father   . Cancer Brother        unknown  . Liver disease Brother        1 brother died of cirrhosis  ROS: Fatigue and some right arm pain but no fevers or chills, productive cough, hemoptysis, dysphasia, odynophagia, melena, hematochezia, dysuria, hematuria, rash, seizure activity, orthopnea, PND, pedal edema, claudication. Remaining systems are negative.  Physical Exam: Well-developed well-nourished in no acute distress.  Skin is warm and dry.  HEENT is normal.  Neck is supple.  Chest is clear to auscultation with normal expansion.  Cardiovascular exam is regular rate and rhythm.  Abdominal exam nontender or distended. No masses palpated. Extremities show no edema. neuro grossly intact  A/P  1 nonischemic cardiomyopathy-etiology is unclear.  As outlined previously she does not abuse alcohol and she did not have coronary disease on prior catheterization.  Previous TSH is normal.  I will arrange a cardiac MRI to exclude infiltrative cardiomyopathy.  Continue losartan and carvedilol.  They have question whether either of these medications could be contributing to pseudogout.  If she has more frequent issues in the future we could discontinue that I have explained she would lose the benefit of the medications.  Her blood pressure appears to be running low based on the readings at home.  I therefore cannot advance the dose of either.  I have asked him to bring the cuff with next office visit and we will correlate with ours.  I have also discussed the possibility of biventricular ICD.  She will consider and will contact as if agreeable to see electrophysiology.  2 chronic  systolic congestive heart failure-patient does not have CHF symptoms at present.  We will continue with fluid restriction and low-sodium diet.  3 ectopic atrial tachycardia versus atrial flutter-continue apixaban at present dose.  Continue carvedilol.  Patient was in sinus rhythm when she presented for cardioversion recently.  I have discussed the possibility of electrophysiology evaluation for ablation and potential to avoid long-term anticoagulation.  They will consider and contact us if agreeable.  4 carotid artery disease-patient declines statins.  She is not on aspirin given need for apixaban.  Not agreeable to statins. Follow-up carotid Dopplers November 2020.  5 tobacco abuse-Needs cessation.  6 history of left bundle branch block-if she would be agreeable to ICD in the future would need CRT-D.  Kirk Ruths, MD

## 2018-12-01 ENCOUNTER — Other Ambulatory Visit: Payer: Self-pay

## 2018-12-01 ENCOUNTER — Encounter: Payer: Self-pay | Admitting: Cardiology

## 2018-12-01 ENCOUNTER — Ambulatory Visit: Payer: Medicare HMO | Admitting: Cardiology

## 2018-12-01 VITALS — BP 144/80 | HR 77 | Temp 97.0°F | Ht 63.0 in | Wt 123.4 lb

## 2018-12-01 DIAGNOSIS — I6523 Occlusion and stenosis of bilateral carotid arteries: Secondary | ICD-10-CM

## 2018-12-01 DIAGNOSIS — I4892 Unspecified atrial flutter: Secondary | ICD-10-CM

## 2018-12-01 DIAGNOSIS — I43 Cardiomyopathy in diseases classified elsewhere: Secondary | ICD-10-CM | POA: Diagnosis not present

## 2018-12-01 DIAGNOSIS — Z720 Tobacco use: Secondary | ICD-10-CM

## 2018-12-01 NOTE — Patient Instructions (Signed)
Medication Instructions:  The current medical regimen is effective;  continue present plan and medications.  If you need a refill on your cardiac medications before your next appointment, please call your pharmacy.   Testing/Procedures: Your physician has requested that you have a cardiac MRI. Cardiac MRI uses a computer to create images of your heart as its beating, producing both still and moving pictures of your heart and major blood vessels. For further information please visit [email or web address] Please follow the instruction sheet given to you today for more information.  Follow-Up: At [facility], you and your health needs are our priority.  As part of our continuing mission to provide you with exceptional heart care, we have created designated Provider Care Teams.  These Care Teams include your primary Cardiologist (physician) and Advanced Practice Providers (APPs -  Physician Assistants and Nurse Practitioners) who all work together to provide you with the care you need, when you need it. You will need a follow up appointment in 3 months.  Please call our office 2 months in advance to schedule this appointment.  You may see Kirk Ruths, MD or one of the following Advanced Practice Providers on your designated Care Team:   Kerin Ransom, PA-C Roby Lofts, [place] . Sande Rives, PA-C  Any Other Special Instructions Will Be Listed Below (If Applicable). Please call the office at [phone] and let us know if you would like to see EP doctor.

## 2018-12-03 DIAGNOSIS — H26492 Other secondary cataract, left eye: Secondary | ICD-10-CM | POA: Diagnosis not present

## 2018-12-08 ENCOUNTER — Telehealth: Payer: Self-pay | Admitting: Cardiology

## 2018-12-08 NOTE — Telephone Encounter (Signed)
Patient calling back.   °

## 2018-12-08 NOTE — Telephone Encounter (Signed)
Follow up     Pt is returning call  Please call back, if unable to reach on mobile number please call  [phone]

## 2018-12-08 NOTE — Telephone Encounter (Signed)
LVM for patient to call back what time of day she would like her cardiac MRI scheduled.

## 2018-12-09 NOTE — Telephone Encounter (Addendum)
Patient balling back to schedule test. If you can not reach her on her cell phone, please call  [phone]. That is her Husband's cell phone. He  gets better service.  It is OK to schedule her for the first available appt.

## 2018-12-15 ENCOUNTER — Ambulatory Visit (INDEPENDENT_AMBULATORY_CARE_PROVIDER_SITE_OTHER): Payer: Medicare HMO | Admitting: Family Medicine

## 2018-12-15 ENCOUNTER — Encounter: Payer: Self-pay | Admitting: Family Medicine

## 2018-12-15 DIAGNOSIS — M25531 Pain in right wrist: Secondary | ICD-10-CM | POA: Diagnosis not present

## 2018-12-15 DIAGNOSIS — M25521 Pain in right elbow: Secondary | ICD-10-CM

## 2018-12-15 MED ORDER — PREDNISONE 10 MG PO TABS: ORAL_TABLET | ORAL | 0 refills | Status: AC

## 2018-12-15 NOTE — Progress Notes (Signed)
I saw and examined the patient with Dr. Mayer Masker and agree with assessment and plan as outlined.  Recurrent right wrist swelling, probably due to pseudogout.  We will treat with oral prednisone.  She is going to discuss her medications with her cardiologist in case 1 of them might be contributing to this.

## 2018-12-15 NOTE — Progress Notes (Signed)
VICTORIAANN [name] - 68 y.o. female MRN [id number]  Date of birth: Mar 11, 1951  Office Visit Note: Visit Date: 12/15/2018 PCP: Maurice Small, MD Referred by: Maurice Small, MD  Subjective: Chief Complaint  Patient presents with  . Right Elbow - Pain    Pain in the elbow, with pain and stiffness in the hand. Started having pain in the right upper chest/anterior shoulder area last night - "I think I've been overcompensating."   HPI: Taylor [name] is a 69 y.o. female who comes in for follow up of right wrist. She continues to have swelling in right wrist, hand, and fingers. Was last seen on 11/17/2018- had injection of wrist at that time. Thinks that it may be due to losartan or carvedilol as there is documentation from cardiology PA that this can cause psuedogout. She feels like carvedilol is causing fatigue as well.   ROS Otherwise per HPI.  Assessment & Plan: Visit Diagnoses:  1. Pain in right wrist   2. Pain in right elbow    Suspect that swelling and pain in wrist, hands, and fingers secondary to pseudo-gout flare. Unclear etiology- patient has documentation from cardiology PA that ARBS and beta blockers can cause pseudo gout. She had minimal relief from steroid injection with only a few days relief. Cannot use colchicine due to medical interactions. Will trial prednisone dose pack. If this does not help, will discuss possibly alternative cardiac medicines to see if discontinuing current medicines will help with symptoms.    Follow-up: No follow-ups on file.    Clinical History: No specialty comments available.   She reports that she has been smoking cigarettes. She has a 20.50 pack-year smoking history. She has never used smokeless tobacco. No results for input(s): HGBA1C, LABURIC in the last 8760 hours.  Objective:  VS:  HT:    WT:   BMI:     BP:   HR: bpm  TEMP: ( )  RESP:  Physical Exam  PHYSICAL EXAM: Gen: NAD, alert, cooperative with exam, well-appearing HEENT: clear  conjunctiva,  CV:  no edema, capillary refill brisk, normal rate Resp: non-labored Skin: no rashes, normal turgor  Neuro: no gross deficits.  Psych:  alert and oriented  Ortho Exam  Right Hand: Inspection: Diffuse swelling of wrist, hand, fingers, loss of landmarks. Swollen MCP joints, 1-5 PIP joints  Palpation: TTP over medial wrist, 1st MCP, CMC. TTP over medial epicondyle. ROM: Reduced ROM of the digits and wrist secondary to swelling and pain. Pain with pronation and supination of arm Strength: strength limited due to pain  Neurovascular: NV intact  Imaging: No results found.  Past Medical/Family/Surgical/Social History: Medications & Allergies reviewed per EMR, new medications updated. Patient Active Problem List   Diagnosis Date Noted  . Dilated cardiomyopathy ([place]) 02/27/2018  . Deviated septum 12/12/2017  . Chronic sinusitis 05/20/2017  . Cough, persistent 05/20/2017  . ETD (Eustachian tube dysfunction), bilateral 05/20/2017  . Nasal turbinate hypertrophy 05/20/2017  . Occlusion and stenosis of carotid artery without mention of cerebral infarction 11/17/2012  . Pain in joint, ankle and foot 02/08/2011  . Difficulty in walking(719.7) 02/08/2011  . Avulsion fracture of ankle 02/08/2011  . Stiffness of joint, not elsewhere classified, ankle and foot 02/08/2011  . Urinary tract infection, site not specified 09/10/2010   Past Medical History:  Diagnosis Date  . Allergy    seasonal allergies  . Arthritis    Bilateral Knees  . Carotid artery occlusion   . Carpal tunnel syndrome   .  CHF (congestive heart failure) ([place])    was "misdiagnosed" and after a heart cath was told there was nothing wrong.; EF 30% with anterior/inferior WMAs 06/17/16; EF 55-65% with 55% mLAD by 06/25/16 cath)  . COPD (chronic obstructive pulmonary disease) ([place])   . Dilated cardiomyopathy ([place]) 02/27/2018  . GERD (gastroesophageal reflux disease)   . Left bundle branch block   . Wears dentures   .  Wears glasses    Family History  Problem Relation Age of Onset  . Aneurysm Mother   . Heart disease Father 80       died of MI  . Heart attack Father   . Cancer Brother        unknown  . Liver disease Brother        1 brother died of cirrhosis   Past Surgical History:  Procedure Laterality Date  . ABDOMINAL HYSTERECTOMY  1974   still has ovaries  . BACK SURGERY  2016   lower back discectomy  . CARPAL TUNNEL RELEASE  08/15/2011   Procedure: CARPAL TUNNEL RELEASE;  Surgeon: Cammie Sickle., MD;  Location: [facility];  Service: Orthopedics;  Laterality: Right;  . CATARACT EXTRACTION W/PHACO Right 10/04/2013   Procedure: CATARACT EXTRACTION PHACO AND INTRAOCULAR LENS PLACEMENT (IOC);  Surgeon: Tonny Branch, MD;  Location: [facility];  Service: Ophthalmology;  Laterality: Right;  CDE 12.10  . CATARACT EXTRACTION W/PHACO Left 10/28/2013   Procedure: CATARACT EXTRACTION PHACO AND INTRAOCULAR LENS PLACEMENT (IOC);  Surgeon: Tonny Branch, MD;  Location: [facility];  Service: Ophthalmology;  Laterality: Left;  CDE: 9.10  . COLONOSCOPY  2006   [place], repeat 2012  . CYST EXCISION     vaginal X3  . EXCISION MORTON'S NEUROMA  11/14/2011   Procedure: EXCISION MORTON'S NEUROMA;  Surgeon: Marcheta Grammes, DPM;  Location: [facility];  Service: Orthopedics;  Laterality: Right;  Excision of Neuroma 2nd Interspace Right Foot  . FOOT NEUROMA SURGERY  2008   right  . HERNIA REPAIR  2000   right inguinal  . KNEE SURGERY  2012   left, cartilage repair  . KNEE SURGERY  2005   left knee, cartilage repair  . KNEE SURGERY  2012   left, cartilage repair  . LEFT HEART CATH AND CORONARY ANGIOGRAPHY N/A 02/27/2018   Procedure: LEFT HEART CATH AND CORONARY ANGIOGRAPHY;  Surgeon: Martinique, Peter M, MD;  Location: [facility];  Service: Cardiovascular;  Laterality: N/A;  . RIGHT/LEFT HEART CATH AND CORONARY ANGIOGRAPHY N/A 06/25/2016   Procedure: Right/Left Heart Cath and Coronary Angiography;   Surgeon: Jolaine Artist, MD;  Location: [facility];  Service: Cardiovascular;  Laterality: N/A;  . ULTRASOUND GUIDANCE FOR VASCULAR ACCESS  02/27/2018   Procedure: Ultrasound Guidance For Vascular Access;  Surgeon: Martinique, Peter M, MD;  Location: [facility];  Service: Cardiovascular;;   Social History   Occupational History  . Not on file  Tobacco Use  . Smoking status: Current Every Day Smoker    Packs/day: 0.50    Years: 41.00    Pack years: 20.50    Types: Cigarettes  . Smokeless tobacco: Never Used  Substance and Sexual Activity  . Alcohol use: Yes    Comment: rare  . Drug use: No  . Sexual activity: Yes    Birth control/protection: Surgical    Comment: widowed; has boyfriend; works for [employer], exercise - walk, hiking, [employer]

## 2018-12-16 ENCOUNTER — Telehealth: Payer: Self-pay | Admitting: Cardiology

## 2018-12-16 ENCOUNTER — Encounter: Payer: Self-pay | Admitting: Cardiology

## 2018-12-16 NOTE — Telephone Encounter (Signed)
LVM for patient regarding her cardiac MRI scheduled for 12-29-18 at 2 p.m. at [facility] and calendar sent to the patient today.

## 2018-12-18 ENCOUNTER — Encounter: Payer: Self-pay | Admitting: Family Medicine

## 2018-12-29 ENCOUNTER — Ambulatory Visit (HOSPITAL_COMMUNITY): Payer: Medicare HMO

## 2019-02-02 DIAGNOSIS — Z1211 Encounter for screening for malignant neoplasm of colon: Secondary | ICD-10-CM | POA: Diagnosis not present

## 2019-02-02 DIAGNOSIS — Z5181 Encounter for therapeutic drug level monitoring: Secondary | ICD-10-CM | POA: Diagnosis not present

## 2019-02-02 DIAGNOSIS — I447 Left bundle-branch block, unspecified: Secondary | ICD-10-CM | POA: Diagnosis not present

## 2019-02-02 DIAGNOSIS — I5022 Chronic systolic (congestive) heart failure: Secondary | ICD-10-CM | POA: Diagnosis not present

## 2019-02-02 DIAGNOSIS — J449 Chronic obstructive pulmonary disease, unspecified: Secondary | ICD-10-CM | POA: Diagnosis not present

## 2019-02-02 DIAGNOSIS — R69 Illness, unspecified: Secondary | ICD-10-CM | POA: Diagnosis not present

## 2019-02-02 DIAGNOSIS — I6523 Occlusion and stenosis of bilateral carotid arteries: Secondary | ICD-10-CM | POA: Diagnosis not present

## 2019-02-02 DIAGNOSIS — R030 Elevated blood-pressure reading, without diagnosis of hypertension: Secondary | ICD-10-CM | POA: Diagnosis not present

## 2019-02-02 DIAGNOSIS — Z0000 Encounter for general adult medical examination without abnormal findings: Secondary | ICD-10-CM | POA: Diagnosis not present

## 2019-02-02 DIAGNOSIS — E785 Hyperlipidemia, unspecified: Secondary | ICD-10-CM | POA: Diagnosis not present

## 2019-02-25 DIAGNOSIS — Z1211 Encounter for screening for malignant neoplasm of colon: Secondary | ICD-10-CM | POA: Diagnosis not present

## 2019-03-01 NOTE — Progress Notes (Deleted)
HPI: FU NICM and CHF.Carotid Dopplers November 2019 showed 60 to 79% right and 40 to 59% left stenosis. Echo December 2019 showed ejection fraction 25 to 30%, mild mitral regurgitation and mild tricuspid regurgitation. Patient again underwent cardiac catheterization in December 2019 and was found to have a 40% proximal LAD and 25 to 35%EF. Filling pressures normal. Patient complained of joint swelling and pain with bisoprolol. By report she did not tolerate Entresto or ACE inhibitor. At previous office visit patient was noted to be in atrial flutter.  Apixaban added.  She was scheduled for cardioversion but was in sinus rhythm upon arrival for procedure.  Cardiac MRI scheduled at last office visit but not performed. Since last seen,   Current Outpatient Medications  Medication Sig Dispense Refill  . apixaban (ELIQUIS) 5 MG TABS tablet Take 1 tablet (5 mg total) by mouth 2 (two) times daily. 60 tablet 6  . carvedilol (COREG) 3.125 MG tablet Take 3.125 mg by mouth 2 (two) times daily with a meal.    . cetirizine (ZYRTEC) 10 MG tablet Take 10 mg by mouth daily.     Marland Kitchen losartan (COZAAR) 25 MG tablet Take 0.5 tablets (12.5 mg total) by mouth daily. 45 tablet 3  . predniSONE (DELTASONE) 10 MG tablet 4 PO qd x 4 d, then 3 PO qd x 4d, then 2 PO qd x 4 d, then 1 PO qd x 4d 40 tablet 0   No current facility-administered medications for this visit.      Past Medical History:  Diagnosis Date  . Allergy    seasonal allergies  . Arthritis    Bilateral Knees  . Carotid artery occlusion   . Carpal tunnel syndrome   . CHF (congestive heart failure) ([place])    was "misdiagnosed" and after a heart cath was told there was nothing wrong.; EF 30% with anterior/inferior WMAs 06/17/16; EF 55-65% with 55% mLAD by 06/25/16 cath)  . COPD (chronic obstructive pulmonary disease) ([place])   . Dilated cardiomyopathy ([place]) 02/27/2018  . GERD (gastroesophageal reflux disease)   . Left bundle branch block   .  Wears dentures   . Wears glasses     Past Surgical History:  Procedure Laterality Date  . ABDOMINAL HYSTERECTOMY  1974   still has ovaries  . BACK SURGERY  2016   lower back discectomy  . CARPAL TUNNEL RELEASE  08/15/2011   Procedure: CARPAL TUNNEL RELEASE;  Surgeon: Cammie Sickle., MD;  Location: [facility];  Service: Orthopedics;  Laterality: Right;  . CATARACT EXTRACTION W/PHACO Right 10/04/2013   Procedure: CATARACT EXTRACTION PHACO AND INTRAOCULAR LENS PLACEMENT (IOC);  Surgeon: Tonny Branch, MD;  [facility];  Service: Ophthalmology;  Laterality: Right;  CDE 12.10  . CATARACT EXTRACTION W/PHACO Left 10/28/2013   Procedure: CATARACT EXTRACTION PHACO AND INTRAOCULAR LENS PLACEMENT (IOC);  Surgeon: Tonny Branch, MD;  [facility];  Service: Ophthalmology;  Laterality: Left;  CDE: 9.10  . COLONOSCOPY  2006   [place], repeat 2012  . CYST EXCISION     vaginal X3  . EXCISION MORTON'S NEUROMA  11/14/2011   Procedure: EXCISION MORTON'S NEUROMA;  Surgeon: Marcheta Grammes, DPM;  [facility];  Service: Orthopedics;  Laterality: Right;  Excision of Neuroma 2nd Interspace Right Foot  . FOOT NEUROMA SURGERY  2008   right  . HERNIA REPAIR  2000   right inguinal  . KNEE SURGERY  2012   left, cartilage repair  .  KNEE SURGERY  2005   left knee, cartilage repair  . KNEE SURGERY  2012   left, cartilage repair  . LEFT HEART CATH AND CORONARY ANGIOGRAPHY N/A 02/27/2018   Procedure: LEFT HEART CATH AND CORONARY ANGIOGRAPHY;  Surgeon: Martinique, Peter M, MD;  Location: [place];  Service: Cardiovascular;  Laterality: N/A;  . RIGHT/LEFT HEART CATH AND CORONARY ANGIOGRAPHY N/A 06/25/2016   Procedure: Right/Left Heart Cath and Coronary Angiography;  Surgeon: Jolaine Artist, MD;  Location: [facility];  Service: Cardiovascular;  Laterality: N/A;  . ULTRASOUND GUIDANCE FOR VASCULAR ACCESS  02/27/2018   Procedure: Ultrasound Guidance For Vascular Access;   Surgeon: Martinique, Peter M, MD;  Location: [facility];  Service: Cardiovascular;;    Social History   Socioeconomic History  . Marital status: Married    Spouse name: Not on file  . Number of children: Not on file  . Years of education: Not on file  . Highest education level: Not on file  Occupational History  . Not on file  Social Needs  . Financial resource strain: Not on file  . Food insecurity    Worry: Not on file    Inability: Not on file  . Transportation needs    Medical: Not on file    Non-medical: Not on file  Tobacco Use  . Smoking status: Current Every Day Smoker    Packs/day: 0.50    Years: 41.00    Pack years: 20.50    Types: Cigarettes  . Smokeless tobacco: Never Used  Substance and Sexual Activity  . Alcohol use: Yes    Comment: rare  . Drug use: No  . Sexual activity: Yes    Birth control/protection: Surgical    Comment: widowed; has boyfriend; works for [employer], exercise - walk, hiking, kayaking  Lifestyle  . Physical activity    Days per week: Not on file    Minutes per session: Not on file  . Stress: Not on file  Relationships  . Social Herbalist on phone: Not on file    Gets together: Not on file    Attends religious service: Not on file    Active member of club or organization: Not on file    Attends meetings of clubs or organizations: Not on file    Relationship status: Not on file  . Intimate partner violence    Fear of current or ex partner: Not on file    Emotionally abused: Not on file    Physically abused: Not on file    Forced sexual activity: Not on file  Other Topics Concern  . Not on file  Social History Narrative  . Not on file    Family History  Problem Relation Age of Onset  . Aneurysm Mother   . Heart disease Father 45       died of MI  . Heart attack Father   . Cancer Brother        unknown  . Liver disease Brother        1 brother died of cirrhosis    ROS: no fevers or chills,  productive cough, hemoptysis, dysphasia, odynophagia, melena, hematochezia, dysuria, hematuria, rash, seizure activity, orthopnea, PND, pedal edema, claudication. Remaining systems are negative.  Physical Exam: Well-developed well-nourished in no acute distress.  Skin is warm and dry.  HEENT is normal.  Neck is supple.  Chest is clear to auscultation with normal expansion.  Cardiovascular exam is  regular rate and rhythm.  Abdominal exam nontender or distended. No masses palpated. Extremities show no edema. neuro grossly intact  ECG- personally reviewed  A/P  1 nonischemic cardiomyopathy-etiology remains unclear.  No coronary artery disease at previous catheterization.  No history of alcohol abuse and TSH normal.  Cardiac MRI was scheduled at most recent office visit but not performed.  We will rearrange.  Continue ARB and beta-blocker.  Her blood pressure is borderline and we therefore cannot advance these medications.  We again discussed the possibility of ICD.  2 chronic systolic congestive heart failure-continue fluid restriction and low-sodium diet.  She is not volume overloaded on examination.  3 history of atrial flutter versus ectopic atrial tachycardia-we will continue with apixaban and carvedilol.  She remains in sinus rhythm.  We again discussed the potential for electrophysiology evaluation for consideration of ablation.  4 tobacco abuse-we discussed the importance of discontinuing.  5 carotid artery disease-patient declined statins.  She is not on aspirin given need for Eliquis.  Plan follow-up carotid Dopplers.  6 left bundle branch block-if she is agreeable to ICD she would need CRT D.  Kirk Ruths, MD

## 2019-03-04 ENCOUNTER — Ambulatory Visit: Payer: Medicare HMO | Admitting: Cardiology

## 2019-05-19 DIAGNOSIS — J0101 Acute recurrent maxillary sinusitis: Secondary | ICD-10-CM | POA: Diagnosis not present

## 2019-11-08 DIAGNOSIS — Z20822 Contact with and (suspected) exposure to covid-19: Secondary | ICD-10-CM | POA: Diagnosis not present

## 2020-01-11 DIAGNOSIS — H3589 Other specified retinal disorders: Secondary | ICD-10-CM | POA: Diagnosis not present

## 2020-02-11 DIAGNOSIS — E785 Hyperlipidemia, unspecified: Secondary | ICD-10-CM | POA: Diagnosis not present

## 2020-02-11 DIAGNOSIS — Z1211 Encounter for screening for malignant neoplasm of colon: Secondary | ICD-10-CM | POA: Diagnosis not present

## 2020-02-11 DIAGNOSIS — I447 Left bundle-branch block, unspecified: Secondary | ICD-10-CM | POA: Diagnosis not present

## 2020-02-11 DIAGNOSIS — Z0000 Encounter for general adult medical examination without abnormal findings: Secondary | ICD-10-CM | POA: Diagnosis not present

## 2020-02-11 DIAGNOSIS — J449 Chronic obstructive pulmonary disease, unspecified: Secondary | ICD-10-CM | POA: Diagnosis not present

## 2020-02-11 DIAGNOSIS — I6523 Occlusion and stenosis of bilateral carotid arteries: Secondary | ICD-10-CM | POA: Diagnosis not present

## 2020-02-11 DIAGNOSIS — F17200 Nicotine dependence, unspecified, uncomplicated: Secondary | ICD-10-CM | POA: Diagnosis not present

## 2020-02-11 DIAGNOSIS — I5022 Chronic systolic (congestive) heart failure: Secondary | ICD-10-CM | POA: Diagnosis not present

## 2020-02-11 DIAGNOSIS — R5382 Chronic fatigue, unspecified: Secondary | ICD-10-CM | POA: Diagnosis not present

## 2020-03-22 DIAGNOSIS — Z1211 Encounter for screening for malignant neoplasm of colon: Secondary | ICD-10-CM | POA: Diagnosis not present

## 2020-04-05 DIAGNOSIS — I6523 Occlusion and stenosis of bilateral carotid arteries: Secondary | ICD-10-CM | POA: Diagnosis not present

## 2020-04-05 DIAGNOSIS — Z720 Tobacco use: Secondary | ICD-10-CM | POA: Diagnosis not present

## 2020-06-05 DIAGNOSIS — E612 Magnesium deficiency: Secondary | ICD-10-CM | POA: Diagnosis not present

## 2020-06-05 DIAGNOSIS — R5382 Chronic fatigue, unspecified: Secondary | ICD-10-CM | POA: Diagnosis not present

## 2020-06-05 DIAGNOSIS — E538 Deficiency of other specified B group vitamins: Secondary | ICD-10-CM | POA: Diagnosis not present

## 2020-06-05 DIAGNOSIS — E559 Vitamin D deficiency, unspecified: Secondary | ICD-10-CM | POA: Diagnosis not present

## 2020-06-05 DIAGNOSIS — G459 Transient cerebral ischemic attack, unspecified: Secondary | ICD-10-CM | POA: Diagnosis not present

## 2020-06-05 DIAGNOSIS — E611 Iron deficiency: Secondary | ICD-10-CM | POA: Diagnosis not present

## 2020-06-06 ENCOUNTER — Other Ambulatory Visit: Payer: Self-pay | Admitting: Family Medicine

## 2020-06-06 DIAGNOSIS — G459 Transient cerebral ischemic attack, unspecified: Secondary | ICD-10-CM

## 2020-06-07 ENCOUNTER — Other Ambulatory Visit (HOSPITAL_COMMUNITY): Payer: Self-pay | Admitting: Family Medicine

## 2020-06-07 DIAGNOSIS — G459 Transient cerebral ischemic attack, unspecified: Secondary | ICD-10-CM

## 2020-06-12 ENCOUNTER — Ambulatory Visit
Admission: RE | Admit: 2020-06-12 | Discharge: 2020-06-12 | Disposition: A | Discharge status: Home / Self Care | Payer: Medicare HMO | Source: Ambulatory Visit | Attending: Family Medicine | Admitting: Family Medicine

## 2020-06-12 DIAGNOSIS — G459 Transient cerebral ischemic attack, unspecified: Secondary | ICD-10-CM

## 2020-06-12 DIAGNOSIS — I6523 Occlusion and stenosis of bilateral carotid arteries: Secondary | ICD-10-CM | POA: Diagnosis not present

## 2020-06-14 DIAGNOSIS — I6523 Occlusion and stenosis of bilateral carotid arteries: Secondary | ICD-10-CM | POA: Diagnosis not present

## 2020-06-15 ENCOUNTER — Other Ambulatory Visit: Payer: Self-pay

## 2020-06-15 ENCOUNTER — Ambulatory Visit
Admission: RE | Admit: 2020-06-15 | Discharge: 2020-06-15 | Disposition: A | Discharge status: Home / Self Care | Payer: Medicare HMO | Source: Ambulatory Visit | Attending: Family Medicine | Admitting: Family Medicine

## 2020-06-15 DIAGNOSIS — G459 Transient cerebral ischemic attack, unspecified: Secondary | ICD-10-CM | POA: Diagnosis not present

## 2020-06-15 DIAGNOSIS — E538 Deficiency of other specified B group vitamins: Secondary | ICD-10-CM | POA: Diagnosis not present

## 2020-06-15 MED ORDER — GADOBENATE DIMEGLUMINE 529 MG/ML IV SOLN
11.0000 mL | Freq: Once | INTRAVENOUS | Status: AC | PRN
  Administered 2020-06-15: 11 mL via INTRAVENOUS

## 2020-06-21 DIAGNOSIS — G459 Transient cerebral ischemic attack, unspecified: Secondary | ICD-10-CM | POA: Diagnosis not present

## 2020-06-21 DIAGNOSIS — I6503 Occlusion and stenosis of bilateral vertebral arteries: Secondary | ICD-10-CM | POA: Diagnosis not present

## 2020-06-21 DIAGNOSIS — I6523 Occlusion and stenosis of bilateral carotid arteries: Secondary | ICD-10-CM | POA: Diagnosis not present

## 2020-06-22 DIAGNOSIS — E538 Deficiency of other specified B group vitamins: Secondary | ICD-10-CM | POA: Diagnosis not present

## 2020-06-29 DIAGNOSIS — E530 Riboflavin deficiency: Secondary | ICD-10-CM | POA: Diagnosis not present

## 2020-07-04 ENCOUNTER — Other Ambulatory Visit: Payer: Self-pay

## 2020-07-04 ENCOUNTER — Ambulatory Visit (HOSPITAL_COMMUNITY): Payer: Medicare HMO | Attending: Internal Medicine

## 2020-07-04 DIAGNOSIS — G459 Transient cerebral ischemic attack, unspecified: Secondary | ICD-10-CM | POA: Diagnosis not present

## 2020-07-04 LAB — ECHOCARDIOGRAM COMPLETE
Area-P 1/2: 2.48 cm2
S' Lateral: 3.2 cm

## 2020-07-06 DIAGNOSIS — E538 Deficiency of other specified B group vitamins: Secondary | ICD-10-CM | POA: Diagnosis not present

## 2020-07-10 DIAGNOSIS — R748 Abnormal levels of other serum enzymes: Secondary | ICD-10-CM | POA: Diagnosis not present

## 2020-07-10 DIAGNOSIS — I5022 Chronic systolic (congestive) heart failure: Secondary | ICD-10-CM | POA: Diagnosis not present

## 2020-07-10 DIAGNOSIS — E559 Vitamin D deficiency, unspecified: Secondary | ICD-10-CM | POA: Diagnosis not present

## 2020-07-10 DIAGNOSIS — E612 Magnesium deficiency: Secondary | ICD-10-CM | POA: Diagnosis not present

## 2020-07-10 DIAGNOSIS — Z5181 Encounter for therapeutic drug level monitoring: Secondary | ICD-10-CM | POA: Diagnosis not present

## 2020-07-10 DIAGNOSIS — R799 Abnormal finding of blood chemistry, unspecified: Secondary | ICD-10-CM | POA: Diagnosis not present

## 2020-07-20 DIAGNOSIS — I420 Dilated cardiomyopathy: Secondary | ICD-10-CM | POA: Diagnosis not present

## 2020-07-20 DIAGNOSIS — F17210 Nicotine dependence, cigarettes, uncomplicated: Secondary | ICD-10-CM | POA: Diagnosis not present

## 2020-07-20 DIAGNOSIS — J449 Chronic obstructive pulmonary disease, unspecified: Secondary | ICD-10-CM | POA: Diagnosis not present

## 2020-07-20 DIAGNOSIS — G459 Transient cerebral ischemic attack, unspecified: Secondary | ICD-10-CM | POA: Diagnosis not present

## 2020-07-20 DIAGNOSIS — I110 Hypertensive heart disease with heart failure: Secondary | ICD-10-CM | POA: Diagnosis not present

## 2020-07-20 DIAGNOSIS — I5022 Chronic systolic (congestive) heart failure: Secondary | ICD-10-CM | POA: Diagnosis not present

## 2020-07-20 DIAGNOSIS — I6521 Occlusion and stenosis of right carotid artery: Secondary | ICD-10-CM | POA: Diagnosis not present

## 2020-08-14 DIAGNOSIS — E611 Iron deficiency: Secondary | ICD-10-CM | POA: Diagnosis not present

## 2020-08-14 DIAGNOSIS — Z5181 Encounter for therapeutic drug level monitoring: Secondary | ICD-10-CM | POA: Diagnosis not present

## 2020-08-14 DIAGNOSIS — K9189 Other postprocedural complications and disorders of digestive system: Secondary | ICD-10-CM | POA: Diagnosis not present

## 2020-08-14 DIAGNOSIS — E538 Deficiency of other specified B group vitamins: Secondary | ICD-10-CM | POA: Diagnosis not present

## 2020-08-14 DIAGNOSIS — K567 Ileus, unspecified: Secondary | ICD-10-CM | POA: Diagnosis not present

## 2020-08-23 DIAGNOSIS — Z720 Tobacco use: Secondary | ICD-10-CM | POA: Diagnosis not present

## 2020-08-23 DIAGNOSIS — Z9889 Other specified postprocedural states: Secondary | ICD-10-CM | POA: Diagnosis not present

## 2020-08-23 DIAGNOSIS — I6523 Occlusion and stenosis of bilateral carotid arteries: Secondary | ICD-10-CM | POA: Diagnosis not present

## 2020-11-14 ENCOUNTER — Encounter: Payer: Self-pay | Admitting: Neurology

## 2020-11-16 LAB — EXTERNAL GENERIC LAB PROCEDURE: COLOGUARD: NEGATIVE

## 2020-11-16 LAB — COLOGUARD: COLOGUARD: NEGATIVE

## 2020-11-30 ENCOUNTER — Other Ambulatory Visit: Payer: Self-pay | Admitting: Family Medicine

## 2020-11-30 DIAGNOSIS — Z1231 Encounter for screening mammogram for malignant neoplasm of breast: Secondary | ICD-10-CM

## 2020-11-30 DIAGNOSIS — E2839 Other primary ovarian failure: Secondary | ICD-10-CM

## 2021-01-04 ENCOUNTER — Other Ambulatory Visit: Payer: Self-pay

## 2021-01-04 ENCOUNTER — Ambulatory Visit
Admission: RE | Admit: 2021-01-04 | Discharge: 2021-01-04 | Disposition: A | Discharge status: Home / Self Care | Payer: Medicare HMO | Source: Ambulatory Visit | Attending: Family Medicine | Admitting: Family Medicine

## 2021-01-04 DIAGNOSIS — Z1231 Encounter for screening mammogram for malignant neoplasm of breast: Secondary | ICD-10-CM

## 2021-02-02 ENCOUNTER — Ambulatory Visit: Payer: Medicare HMO | Admitting: Neurology

## 2021-02-05 ENCOUNTER — Other Ambulatory Visit: Payer: Self-pay | Admitting: Student

## 2021-02-05 DIAGNOSIS — F17210 Nicotine dependence, cigarettes, uncomplicated: Secondary | ICD-10-CM

## 2021-02-14 ENCOUNTER — Other Ambulatory Visit: Payer: Self-pay

## 2021-02-14 ENCOUNTER — Ambulatory Visit
Admission: RE | Admit: 2021-02-14 | Discharge: 2021-02-14 | Disposition: A | Discharge status: Home / Self Care | Payer: Medicare HMO | Source: Ambulatory Visit | Attending: Student | Admitting: Student

## 2021-02-14 DIAGNOSIS — F17210 Nicotine dependence, cigarettes, uncomplicated: Secondary | ICD-10-CM

## 2021-12-28 ENCOUNTER — Other Ambulatory Visit: Payer: Self-pay | Admitting: Orthopedic Surgery

## 2021-12-28 DIAGNOSIS — M4316 Spondylolisthesis, lumbar region: Secondary | ICD-10-CM

## 2022-01-11 ENCOUNTER — Ambulatory Visit
Admission: RE | Admit: 2022-01-11 | Discharge: 2022-01-11 | Disposition: A | Discharge status: Home / Self Care | Payer: Medicare HMO | Source: Ambulatory Visit | Attending: Orthopedic Surgery | Admitting: Orthopedic Surgery

## 2022-01-11 DIAGNOSIS — M4316 Spondylolisthesis, lumbar region: Secondary | ICD-10-CM

## 2022-03-01 ENCOUNTER — Other Ambulatory Visit: Payer: Self-pay

## 2022-03-01 ENCOUNTER — Other Ambulatory Visit: Payer: Self-pay | Admitting: Family Medicine

## 2022-03-01 DIAGNOSIS — Z1231 Encounter for screening mammogram for malignant neoplasm of breast: Secondary | ICD-10-CM

## 2022-04-22 ENCOUNTER — Ambulatory Visit
Admission: RE | Admit: 2022-04-22 | Discharge: 2022-04-22 | Disposition: A | Discharge status: Home / Self Care | Payer: Medicare HMO | Source: Ambulatory Visit | Attending: Family Medicine | Admitting: Family Medicine

## 2022-04-22 DIAGNOSIS — Z1231 Encounter for screening mammogram for malignant neoplasm of breast: Secondary | ICD-10-CM

## 2022-04-25 ENCOUNTER — Ambulatory Visit: Payer: Medicare HMO

## 2023-02-13 ENCOUNTER — Other Ambulatory Visit: Payer: Self-pay

## 2023-02-13 DIAGNOSIS — Z122 Encounter for screening for malignant neoplasm of respiratory organs: Secondary | ICD-10-CM

## 2023-02-13 DIAGNOSIS — F17210 Nicotine dependence, cigarettes, uncomplicated: Secondary | ICD-10-CM

## 2023-02-13 DIAGNOSIS — Z87891 Personal history of nicotine dependence: Secondary | ICD-10-CM

## 2023-02-18 ENCOUNTER — Ambulatory Visit: Payer: Medicare HMO | Admitting: Acute Care

## 2023-02-18 ENCOUNTER — Encounter: Payer: Self-pay | Admitting: Acute Care

## 2023-02-18 DIAGNOSIS — F17210 Nicotine dependence, cigarettes, uncomplicated: Secondary | ICD-10-CM

## 2023-02-18 NOTE — Patient Instructions (Signed)
 Thank you for participating in the  Lung Cancer Screening Program. It was our pleasure to meet you today. We will call you with the results of your scan within the next few days. Your scan will be assigned a Lung RADS category score by the physicians reading the scans.  This Lung RADS score determines follow up scanning.  See below for description of categories, and follow up screening recommendations. We will be in touch to schedule your follow up screening annually or based on recommendations of our providers. We will fax a copy of your scan results to your Primary Care Physician, or the physician who referred you to the program, to ensure they have the results. Please call the office if you have any questions or concerns regarding your scanning experience or results.  Our office number is [phone]. Please speak with Abigail Miyamoto, RN., Karlton Lemon RN, or Pietro Cassis RN. They are  our Lung Cancer Screening RN.'s If They are unavailable when you call, Please leave a message on the voice mail. We will return your call at our earliest convenience.This voice mail is monitored several times a day.  Remember, if your scan is normal, we will scan you annually as long as you continue to meet the criteria for the program. (Age 74-80, Current smoker or smoker who has quit within the last 15 years). If you are a smoker, remember, quitting is the single most powerful action that you can take to decrease your risk of lung cancer and other pulmonary, breathing related problems. We know quitting is hard, and we are here to help.  Please let us know if there is anything we can do to help you meet your goal of quitting. If you are a former smoker, Counselling psychologist. We are proud of you! Remain smoke free! Remember you can refer friends or family members through the number above.  We will screen them to make sure they meet criteria for the program. Thank you for helping [place] take better care of you  by participating in Lung Screening.   Lung RADS Categories:  Lung RADS 1: no nodules or definitely non-concerning nodules.  Recommendation is for a repeat annual scan in 12 months.  Lung RADS 2:  nodules that are non-concerning in appearance and behavior with a very low likelihood of becoming an active cancer. Recommendation is for a repeat annual scan in 12 months.  Lung RADS 3: nodules that are probably non-concerning , includes nodules with a low likelihood of becoming an active cancer.  Recommendation is for a 40-month repeat screening scan. Often noted after an upper respiratory illness. We will be in touch to make sure you have no questions, and to schedule your 74-month scan.  Lung RADS 4 A: nodules with concerning findings, recommendation is most often for a follow up scan in 3 months or additional testing based on our provider's assessment of the scan. We will be in touch to make sure you have no questions and to schedule the recommended 3 month follow up scan.  Lung RADS 4 B:  indicates findings that are concerning. We will be in touch with you to schedule additional diagnostic testing based on our provider's  assessment of the scan.  You can receive free nicotine replacement therapy ( patches, gum or mints) by calling 1-800-QUIT NOW. Please call so we can get you on the path to becoming  a non-smoker. I know it is hard, but you can do this!  Other options for assistance in  smoking cessation ( As covered by your insurance benefits)  Hypnosis for smoking cessation  [employer] [phone]  Acupuncture for smoking cessation  [employer] [phone]

## 2023-02-18 NOTE — Progress Notes (Signed)
Virtual Visit via Telephone Note  I connected with ELFA DEPASS on 02/18/23 at  3:30 PM EST by telephone and verified that I am speaking with the correct person using two identifiers.  Location: Patient:  At home Provider:  [address], [place], [place], [address]    I discussed the limitations, risks, security and privacy concerns of performing an evaluation and management service by telephone and the availability of in person appointments. I also discussed with the patient that there may be a patient responsible charge related to this service. The patient expressed understanding and agreed to proceed.   Shared Decision Making Visit Lung Cancer Screening Program [phone])   Eligibility: Age 67 y.o. Pack Years Smoking History Calculation 89.25 pack years (# packs/per year x # years smoked) Recent History of coughing up blood  no Unexplained weight loss? no ( >Than 15 pounds within the last 6 months ) Prior History Lung / other cancer no (Diagnosis within the last 5 years already requiring surveillance chest CT Scans). Smoking Status Current Smoker Former Smokers: Years since quit:  NA  Quit Date:  NA  Visit Components: Discussion included one or more decision making aids. yes Discussion included risk/benefits of screening. yes Discussion included potential follow up diagnostic testing for abnormal scans. yes Discussion included meaning and risk of over diagnosis. yes Discussion included meaning and risk of False Positives. yes Discussion included meaning of total radiation exposure. yes  Counseling Included: Importance of adherence to annual lung cancer LDCT screening. yes Impact of comorbidities on ability to participate in the program. yes Ability and willingness to under diagnostic treatment. yes  Smoking Cessation Counseling: Current Smokers:  Discussed importance of smoking cessation. yes Information about tobacco cessation classes and interventions provided  to patient. yes Patient provided with "ticket" for LDCT Scan. yes Symptomatic Patient. no  Counseling NA Diagnosis Code: Tobacco Use Z72.0 Asymptomatic Patient yes  Counseling (Intermediate counseling: > three minutes counseling) U9811 Former Smokers:  Discussed the importance of maintaining cigarette abstinence. yes Diagnosis Code: Personal History of Nicotine Dependence. B14.782 Information about tobacco cessation classes and interventions provided to patient. Yes Patient provided with "ticket" for LDCT Scan. yes Written Order for Lung Cancer Screening with LDCT placed in Epic. Yes (CT Chest Lung Cancer Screening Low Dose W/O CM) [id number] Z12.2-Screening of respiratory organs Z87.891-Personal history of nicotine dependence     Bevelyn Ngo, NP 02/18/2023

## 2023-02-27 ENCOUNTER — Ambulatory Visit (HOSPITAL_COMMUNITY)
Admission: RE | Admit: 2023-02-27 | Discharge: 2023-02-27 | Disposition: A | Discharge status: Home / Self Care | Payer: Medicare HMO | Source: Ambulatory Visit | Attending: Family Medicine | Admitting: Family Medicine

## 2023-02-27 DIAGNOSIS — Z87891 Personal history of nicotine dependence: Secondary | ICD-10-CM | POA: Insufficient documentation

## 2023-02-27 DIAGNOSIS — Z122 Encounter for screening for malignant neoplasm of respiratory organs: Secondary | ICD-10-CM | POA: Diagnosis present

## 2023-02-27 DIAGNOSIS — F17210 Nicotine dependence, cigarettes, uncomplicated: Secondary | ICD-10-CM | POA: Insufficient documentation

## 2023-03-13 ENCOUNTER — Other Ambulatory Visit: Payer: Self-pay | Admitting: Acute Care

## 2023-03-13 DIAGNOSIS — Z87891 Personal history of nicotine dependence: Secondary | ICD-10-CM

## 2023-03-13 DIAGNOSIS — Z122 Encounter for screening for malignant neoplasm of respiratory organs: Secondary | ICD-10-CM

## 2023-03-13 DIAGNOSIS — F17210 Nicotine dependence, cigarettes, uncomplicated: Secondary | ICD-10-CM

## 2023-05-20 ENCOUNTER — Other Ambulatory Visit: Payer: Self-pay | Admitting: Nurse Practitioner

## 2023-05-20 DIAGNOSIS — Z1231 Encounter for screening mammogram for malignant neoplasm of breast: Secondary | ICD-10-CM

## 2023-05-23 ENCOUNTER — Ambulatory Visit
Admission: RE | Admit: 2023-05-23 | Discharge: 2023-05-23 | Disposition: A | Discharge status: Home / Self Care | Payer: Medicare HMO | Source: Ambulatory Visit | Attending: Nurse Practitioner | Admitting: Nurse Practitioner

## 2023-05-23 DIAGNOSIS — Z1231 Encounter for screening mammogram for malignant neoplasm of breast: Secondary | ICD-10-CM

## 2023-11-23 LAB — COLOGUARD: COLOGUARD: NEGATIVE
